# Patient Record
Sex: Male | Born: 1945 | Race: White | Hispanic: No | Marital: Married | Smoking: Current every day smoker
Health system: Southern US, Community
[De-identification: ages and names within clinical notes are randomized; demographics above are authoritative.]

## PROBLEM LIST (undated history)

## (undated) DIAGNOSIS — G459 Transient cerebral ischemic attack, unspecified: Secondary | ICD-10-CM

## (undated) DIAGNOSIS — D751 Secondary polycythemia: Secondary | ICD-10-CM

## (undated) DIAGNOSIS — I10 Essential (primary) hypertension: Secondary | ICD-10-CM

## (undated) DIAGNOSIS — H269 Unspecified cataract: Secondary | ICD-10-CM

## (undated) DIAGNOSIS — R7303 Prediabetes: Secondary | ICD-10-CM

## (undated) DIAGNOSIS — M109 Gout, unspecified: Secondary | ICD-10-CM

## (undated) DIAGNOSIS — J45909 Unspecified asthma, uncomplicated: Secondary | ICD-10-CM

## (undated) DIAGNOSIS — D72829 Elevated white blood cell count, unspecified: Secondary | ICD-10-CM

## (undated) DIAGNOSIS — J449 Chronic obstructive pulmonary disease, unspecified: Secondary | ICD-10-CM

## (undated) DIAGNOSIS — E7800 Pure hypercholesterolemia, unspecified: Secondary | ICD-10-CM

## (undated) HISTORY — DX: Prediabetes: R73.03

## (undated) HISTORY — DX: Unspecified cataract: H26.9

## (undated) HISTORY — DX: Elevated white blood cell count, unspecified: D72.829

## (undated) HISTORY — DX: Chronic obstructive pulmonary disease, unspecified: J44.9

## (undated) HISTORY — DX: Gout, unspecified: M10.9

## (undated) HISTORY — DX: Pure hypercholesterolemia, unspecified: E78.00

## (undated) HISTORY — DX: Transient cerebral ischemic attack, unspecified: G45.9

## (undated) HISTORY — DX: Secondary polycythemia: D75.1

## (undated) HISTORY — PX: HERNIA REPAIR: SHX51

## (undated) HISTORY — DX: Essential (primary) hypertension: I10

---

## 2006-09-07 ENCOUNTER — Emergency Department (HOSPITAL_COMMUNITY): Admission: EM | Admit: 2006-09-07 | Discharge: 2006-09-07 | Payer: Self-pay | Admitting: Emergency Medicine

## 2008-01-10 ENCOUNTER — Ambulatory Visit (HOSPITAL_COMMUNITY): Admission: RE | Admit: 2008-01-10 | Discharge: 2008-01-10 | Payer: Self-pay | Admitting: Internal Medicine

## 2008-01-10 ENCOUNTER — Ambulatory Visit: Payer: Self-pay | Admitting: Internal Medicine

## 2008-01-29 ENCOUNTER — Ambulatory Visit: Payer: Self-pay | Admitting: Internal Medicine

## 2008-01-29 ENCOUNTER — Ambulatory Visit (HOSPITAL_COMMUNITY): Admission: RE | Admit: 2008-01-29 | Discharge: 2008-01-29 | Payer: Self-pay | Admitting: Internal Medicine

## 2008-01-29 ENCOUNTER — Encounter: Payer: Self-pay | Admitting: Internal Medicine

## 2010-10-13 NOTE — Op Note (Signed)
NAME:  John Mcgrath, John Mcgrath                 ACCOUNT NO.:  [id number]   MEDICAL RECORD NO.:  [id number]          PATIENT TYPE:  AMB   LOCATION:  DAY                           FACILITY:  [facility]   PHYSICIAN:  R. Roetta Sessions, M.D. DATE OF BIRTH:  05-Dec-1945   DATE OF PROCEDURE:  01/29/2008  DATE OF DISCHARGE:                               OPERATIVE REPORT   PROCEDURE:  Ileocolonoscopy with snare polypectomy.   INDICATIONS FOR PROCEDURE:  This is a 65 year old gentleman sent over at  courtesy of [facility]inf[facility], FNP, in [place] for colorectal cancer  screening.  We attempted colonoscopy on January 10, 2008, but reported  with an most inadequate prep as a questionable rectal polyp at that time  seen.  He is now brought back in the setting of, hopefully, better prep.  Risks, benefits, alternatives, and limitations have been reviewed and  questions answered.  Please see the documentation in the medical record.   PROCEDURE NOTE:  O2 saturation, blood pressure, pulse, and respirations  were monitored throughout the entire procedure.   CONSCIOUS SEDATION:  Versed 3 mg IV and Demerol 75 mg IV in divided  doses.   INSTRUMENT:  Pentax video chip system.   FINDINGS:  Digital rectal exam revealed no abnormalities.  The scope was  placed, and the prep was adequate.  Colon:  Colonic mucosa was surveyed  from the rectosigmoid junction through the left transverse right colon  to the appendiceal orifice, ileocecal valve, and cecum.  These  structures were well seen and photographed for the record.  Terminal  ileum was intubated to 10 cm from this level.  The scope was slowly  withdrawn.  All previous mentioned mucosal surfaces were again seen.  The patient was noted to have pancolonic diverticula.  There were some  tiny submucosal petechiae around some of the sigmoid TICS; otherwise,  the colonic mucosa appeared entirely normal as did the terminal ileal  mucosa.  The scope was then pulled down into the  rectum where thorough  examination of rectal mucosa including retroflexed view of the anal  verge demonstrated an 8-mm polyp at 15 cm from the anal verge.  There  appeared to be somewhat hyperplastic.  It was removed with hot snare  cautery and recovered through the scope.  Remainder of the rectal mucosa  appeared normal.  The patient tolerated the procedure well and was  reacted in Endoscopy.   IMPRESSION:  1. Rectal polyp status post hot snare polypectomy.  2. Pancolonic diverticula.  3. Tiny petechiae sigmoid segment of doubtful clinical significance.  4. Remainder of the colonic mucosa and terminal mucosa appeared      normal.   RECOMMENDATIONS:  1. Diverticulosis.  Polyp literature provided to Mr. Hoeg.  2. Followup on path.  3. Further recommendations to follow.      Jonathon Bellows, M.D.  Electronically Signed     RMR/MEDQ  D:  01/29/2008  T:  01/30/2008  Job:  [id number]   cc:   Olegario Messier L. Jarold Motto, FNP

## 2010-10-13 NOTE — Op Note (Signed)
NAME:  John Mcgrath, John Mcgrath                 ACCOUNT NO.:  [id number]   MEDICAL RECORD NO.:  [id number]          PATIENT TYPE:  AMB   LOCATION:  DAY                           FACILITY:  [facility]   PHYSICIAN:  R. Roetta Sessions, M.D. DATE OF BIRTH:  May 12, 1946   DATE OF PROCEDURE:  01/10/2008  DATE OF DISCHARGE:                               OPERATIVE REPORT   PROCEDURE:  Incomplete colonoscopy.   INDICATIONS FOR PROCEDURE:  A 65 year old gentleman with no lower GI  tract symptoms, sent over at courtesy of [facility] in [place]  for colorectal cancer screening.  He has never had his lower GI tract  evaluated.  He has no lower GI tract symptoms.  There is no family  history of colorectal neoplasia.  The colonoscopy is now being done as  screening maneuver.  This approach has been discussed with the patient  at length.  Risks, benefits, and alternatives have been reviewed.  Questions answered.  Please see documentation in the medical record.   PROCEDURE NOTE:  O2 saturation, blood pressure, pulse, and respirations  were monitored throughout the entire procedure.   CONSCIOUS SEDATION:  Versed 3 mg IV, Demerol 75 mg IV in divided doses.   INSTRUMENT:  Pentax video chip system.   FINDINGS:  Digital rectal exam revealed no abnormalities.  Endoscopic  findings:  The prep was poor in the rectosigmoid and as I advanced the  scope up into the left colon, it became evident that the patient was not  adequately prepped with formed stool and tenacious semi-formed stool  lining the colonic wall, it was a very poor prep.  I did note some  sigmoid diverticula and in the rectum, there is a 1-cm flat adenomatous  appearing polyp in at approximately 15 cm.  The prep really rendered the  colonoscopy not doable today.  I elected to terminate the procedure.  The patient tolerated the brief procedure very well.   IMPRESSION:  Poor inadequate prep for complete colonoscopy (and very  poor for attempt at hot  snare polypectomy).  He does have a rectal polyp  and he has sigmoid diverticula at a minimum.   It is notable, this gentleman had to be rescheduled from yesterday due  to my untimely visit to the emergency department.  He tells me that he  only consumed a half-can of chicken noodle soup since yesterday, but  even so this pretty much amounts to a poor colon prep day before  yesterday.   RECOMMENDATIONS:  We will need to regroup and bring him back in the  setting of hopefully an adequate prep to clear his colon and remove  whatever polyps may exist.  This approach has been discussed with Mr.  Francesconi.      Jonathon Bellows, M.D.  Electronically Signed     RMR/MEDQ  D:  01/10/2008  T:  01/11/2008  Job:  [id number]   cc:   [place], [facility] L. Jarold Motto, FNP

## 2010-11-11 ENCOUNTER — Ambulatory Visit (HOSPITAL_COMMUNITY)
Admission: RE | Admit: 2010-11-11 | Discharge: 2010-11-11 | Disposition: A | Discharge status: Home / Self Care | Payer: BC Managed Care – PPO | Source: Ambulatory Visit | Attending: Family Medicine | Admitting: Family Medicine

## 2010-11-11 ENCOUNTER — Other Ambulatory Visit: Payer: Self-pay | Admitting: Family Medicine

## 2010-11-11 DIAGNOSIS — J4489 Other specified chronic obstructive pulmonary disease: Secondary | ICD-10-CM | POA: Insufficient documentation

## 2010-11-11 DIAGNOSIS — J449 Chronic obstructive pulmonary disease, unspecified: Secondary | ICD-10-CM | POA: Insufficient documentation

## 2010-11-11 DIAGNOSIS — R0989 Other specified symptoms and signs involving the circulatory and respiratory systems: Secondary | ICD-10-CM | POA: Insufficient documentation

## 2010-11-11 DIAGNOSIS — R0609 Other forms of dyspnea: Secondary | ICD-10-CM | POA: Insufficient documentation

## 2013-08-28 ENCOUNTER — Encounter (HOSPITAL_COMMUNITY): Payer: Medicare Other

## 2013-08-28 ENCOUNTER — Encounter (HOSPITAL_COMMUNITY): Payer: Self-pay

## 2013-08-28 ENCOUNTER — Encounter (HOSPITAL_COMMUNITY): Payer: Medicare Other | Attending: Hematology and Oncology

## 2013-08-28 VITALS — BP 135/90 | HR 57 | Temp 97.6°F | Resp 20 | Ht 70.0 in | Wt 214.0 lb

## 2013-08-28 DIAGNOSIS — D72820 Lymphocytosis (symptomatic): Secondary | ICD-10-CM | POA: Insufficient documentation

## 2013-08-28 DIAGNOSIS — J441 Chronic obstructive pulmonary disease with (acute) exacerbation: Secondary | ICD-10-CM | POA: Insufficient documentation

## 2013-08-28 DIAGNOSIS — J449 Chronic obstructive pulmonary disease, unspecified: Secondary | ICD-10-CM | POA: Insufficient documentation

## 2013-08-28 DIAGNOSIS — J309 Allergic rhinitis, unspecified: Secondary | ICD-10-CM | POA: Insufficient documentation

## 2013-08-28 DIAGNOSIS — R0902 Hypoxemia: Secondary | ICD-10-CM | POA: Insufficient documentation

## 2013-08-28 DIAGNOSIS — D751 Secondary polycythemia: Secondary | ICD-10-CM

## 2013-08-28 DIAGNOSIS — L299 Pruritus, unspecified: Secondary | ICD-10-CM | POA: Insufficient documentation

## 2013-08-28 DIAGNOSIS — E7800 Pure hypercholesterolemia, unspecified: Secondary | ICD-10-CM | POA: Insufficient documentation

## 2013-08-28 DIAGNOSIS — Z8673 Personal history of transient ischemic attack (TIA), and cerebral infarction without residual deficits: Secondary | ICD-10-CM | POA: Insufficient documentation

## 2013-08-28 DIAGNOSIS — R7309 Other abnormal glucose: Secondary | ICD-10-CM | POA: Insufficient documentation

## 2013-08-28 DIAGNOSIS — D45 Polycythemia vera: Secondary | ICD-10-CM | POA: Insufficient documentation

## 2013-08-28 DIAGNOSIS — F17200 Nicotine dependence, unspecified, uncomplicated: Secondary | ICD-10-CM | POA: Insufficient documentation

## 2013-08-28 DIAGNOSIS — I10 Essential (primary) hypertension: Secondary | ICD-10-CM | POA: Insufficient documentation

## 2013-08-28 DIAGNOSIS — M109 Gout, unspecified: Secondary | ICD-10-CM | POA: Insufficient documentation

## 2013-08-28 DIAGNOSIS — J4489 Other specified chronic obstructive pulmonary disease: Secondary | ICD-10-CM | POA: Insufficient documentation

## 2013-08-28 DIAGNOSIS — D72829 Elevated white blood cell count, unspecified: Secondary | ICD-10-CM | POA: Insufficient documentation

## 2013-08-28 LAB — CBC WITH DIFFERENTIAL/PLATELET
BASOS ABS: 0 10*3/uL (ref 0.0–0.1)
BASOS PCT: 0 % (ref 0–1)
EOS PCT: 2 % (ref 0–5)
Eosinophils Absolute: 0.2 10*3/uL (ref 0.0–0.7)
HCT: 52.8 % — ABNORMAL HIGH (ref 39.0–52.0)
Hemoglobin: 17.9 g/dL — ABNORMAL HIGH (ref 13.0–17.0)
Lymphocytes Relative: 47 % — ABNORMAL HIGH (ref 12–46)
Lymphs Abs: 5 10*3/uL — ABNORMAL HIGH (ref 0.7–4.0)
MCH: 32.3 pg (ref 26.0–34.0)
MCHC: 33.9 g/dL (ref 30.0–36.0)
MCV: 95.1 fL (ref 78.0–100.0)
Monocytes Absolute: 0.8 10*3/uL (ref 0.1–1.0)
Monocytes Relative: 7 % (ref 3–12)
Neutro Abs: 4.7 10*3/uL (ref 1.7–7.7)
Neutrophils Relative %: 44 % (ref 43–77)
PLATELETS: 147 10*3/uL — AB (ref 150–400)
RBC: 5.55 MIL/uL (ref 4.22–5.81)
RDW: 13.2 % (ref 11.5–15.5)
WBC: 10.7 10*3/uL — ABNORMAL HIGH (ref 4.0–10.5)

## 2013-08-28 LAB — COMPREHENSIVE METABOLIC PANEL
ALT: 13 U/L (ref 0–53)
AST: 18 U/L (ref 0–37)
Albumin: 3.4 g/dL — ABNORMAL LOW (ref 3.5–5.2)
Alkaline Phosphatase: 76 U/L (ref 39–117)
BUN: 20 mg/dL (ref 6–23)
CALCIUM: 9.2 mg/dL (ref 8.4–10.5)
CO2: 35 meq/L — AB (ref 19–32)
CREATININE: 0.99 mg/dL (ref 0.50–1.35)
Chloride: 95 mEq/L — ABNORMAL LOW (ref 96–112)
GFR calc Af Amer: >90 mL/min (ref >90)
GFR, EST NON AFRICAN AMERICAN: 84 mL/min — AB (ref >90)
GLUCOSE: 98 mg/dL (ref 70–99)
Potassium: 4.5 mEq/L (ref 3.7–5.3)
Sodium: 138 mEq/L (ref 137–147)
Total Bilirubin: 0.4 mg/dL (ref 0.3–1.2)
Total Protein: 6.9 g/dL (ref 6.0–8.3)

## 2013-08-28 LAB — LACTATE DEHYDROGENASE: LDH: 244 U/L (ref 94–250)

## 2013-08-28 LAB — RETICULOCYTES
RBC.: 5.55 MIL/uL (ref 4.22–5.81)
Retic Count, Absolute: 99.9 10*3/uL (ref 19.0–186.0)
Retic Ct Pct: 1.8 % (ref 0.4–3.1)

## 2013-08-28 LAB — CARBOXYHEMOGLOBIN
CARBOXYHEMOGLOBIN: 8.4 % — AB (ref 0.5–1.5)
Methemoglobin: 1.4 % (ref 0.0–1.5)
O2 Saturation: 60 %
Total hemoglobin: 19.4 g/dL — ABNORMAL HIGH (ref 13.5–18.0)
Total oxygen content: 14.7 mL/dL — ABNORMAL LOW (ref 15.0–23.0)

## 2013-08-28 NOTE — Patient Instructions (Signed)
[place] Discharge Instructions  RECOMMENDATIONS MADE BY THE CONSULTANT AND ANY TEST RESULTS WILL BE SENT TO YOUR REFERRING PHYSICIAN.  EXAM FINDINGS BY THE PHYSICIAN TODAY AND SIGNS OR SYMPTOMS TO REPORT TO CLINIC OR PRIMARY PHYSICIAN:  Return Thursday April 2 @ 9:30 for labs. This will complete your lab work up for now. Come to 4th floor and sign in.   Pulmonary Function Test (lung function test) in 3 weeks Wednesday September 19, 2012 @ 2:30. See the attached information so that you can get familiar with this test. When you come for this test, please stop at the desk @ the Main Entrance and the receptionist will register you. You will then come up to the 4th floor.   Return to see Dr. Barnet Glasgow in 3 weeks. Friday September 22, 2103 @ 8:30. Come to 4th floor.   Sleep study testing is the only thing left to get scheduled. Mazzie our scheduler will be in touch with you regarding this appointment date & time. This is done on the 4th floor also.  Decrease the amount of cigarettes you smoke daily by 1. If you can get down to 3 or 4 cigarettes a day, you will feel better.    Thank you for choosing [facility] to provide your oncology and hematology care.  To afford each patient quality time with our providers, please arrive at least 15 minutes before your scheduled appointment time.  With your help, our goal is to use those 15 minutes to complete the necessary work-up to ensure our physicians have the information they need to help with your evaluation and healthcare recommendations.    Effective January 1st, 2014, we ask that you re-schedule your appointment with our physicians should you arrive 10 or more minutes late for your appointment.  We strive to give you quality time with our providers, and arriving late affects you and other patients whose appointments are after yours.    Again, thank you for choosing [facility].  Our hope is that these requests  will decrease the amount of time that you wait before being seen by our physicians.       _____________________________________________________________  Should you have questions after your visit to [facility], please contact our office at [phone] between the hours of 8:30 a.m. and 5:00 p.m.  Voicemails left after 4:30 p.m. will not be returned until the following business day.  For prescription refill requests, have your pharmacy contact our office with your prescription refill request.    Pulmonary Function Tests Pulmonary function tests (PFTs) measure how well your lungs are working. The tests can help to identify the causes of lung problems. They can also help your health care provider select the best treatment for you. Your health care provider may order pulmonary function for any of the following reasons:  When an illness involving the lungs is suspected.  To follow changes in your lung function over time if you are known to have a chronic lung disease.  For industrial plant workers to examine the effects of being exposed to chemicals over a long period of time.  To assess lung function prior to surgery or other procedures.  For people who are smokers. Your measured lung function will be compared to the expected lung function of someone with healthy lungs who is similar to you in age, gender, size, and other factors. This is used to determine your "percent predicted" lung function, which is  how your health care provider knows if your lung function is normal or abnormal. If you have had prior pulmonary function testing performed, your health care provider will also compare your current results with past tests to see if your lung function is better, worse, or staying the same. This can sometimes be useful to see if treatments are working.  LET [facility] CARE PROVIDER KNOW ABOUT:  Any allergies you have.  All medicines you are taking, including inhaler or nebulizer  medicines, vitamins, herbs, eye drops, creams, and over-the-counter medicines.  Any blood disorders you have.  Previous surgeries you have had, especially recent eye surgery, abdominal surgery, or chest surgery. These can make performing pulmonary function tests difficult or unsafe.  Medical conditions you have.  Chest pain or heart problems.  Tuberculosis or respiratory infections, such as pneumonia, a cold, or the flu. If you think you will have difficulty performing any of the breathing maneuvers, ask your health care provider if you should reschedule the test. RISKS AND COMPLICATIONS: Generally, pulmonary function testing is a safe procedure. However, as with any procedure, complications can occur. Possible complications include:  Lightheadedness due to overbreathing (hyperventilation).  An asthmatic attack from deep breathing. BEFORE THE PROCEDURE  Take medicine as directed by your health care provider. If you take inhaler or nebulizer medicines, ask your health care provider which medicines you should take on the day of your test. Some inhaler medicines may interfere with pulmonary function tests, such as bronchodilator testing, if taken shortly before the test.  Avoid eating a large meal before your test.  Do not smoke before your test.  Wear comfortable clothing which will not interfere with breathing. PROCEDURE  You will be given a soft nose clip to wear during the procedure. This is done so that all of your breaths will go through your mouth instead of your nose.  You will be given a germ-free (sterile) mouthpiece. It will be attached to a spirometer. The spirometer is the machine that measures your breathing.  You will be instructed to perform various breathing maneuvers. The maneuvers will be done by breathing in (inhaling) and breathing out (exhaling). Depending on what measurements are ordered, you may be asked to repeat the maneuvers several times before the test is  completed.  It is important to follow the instructions exactly to obtain accurate results. Make sure to blow as hard and as fast as you can when you are instructed to do so.  You may be given a bronchodilator after testing has been performed. A bronchodilator is a medicine which makes the small air passages in your lungs larger. These medicines usually make it easier to breathe. The tests are then repeated several minutes later after the bronchodilator has taken effect.  You will be monitored carefully during the procedure for faintness, dizziness, difficulty breathing, or any other problems. AFTER THE PROCEDURE   You may resume your usual diet, medicines, and activities as directed by your health care provider.  Your health care provider will go over your test results with you and determine what treatments may be helpful. Document Released: 01/08/2004 Document Revised: 03/07/2013 Document Reviewed: 12/14/2012 [facility] Patient Information 2014 [place], [place].

## 2013-08-28 NOTE — Progress Notes (Signed)
CRITICAL VALUE ALERT  Critical value received:  Carboxyhemoglobin 8.4 Date of notification: 08/28/13 Time of notification:  1628 Critical value read back:yes Nurse who received alert:  Lupita Raider RN Dr. Barnet Glasgow notified @ [phone].  John Mcgrath presented for labwork. Labs per MD order drawn via Peripheral Line 23 gauge needle inserted in LT AC Good blood return present. Procedure without incident.  Needle removed intact. Patient tolerated procedure well.   Per [place], someone in Respiratory will notify patient of sleep study appointment date & time. Patient and his wife notified.

## 2013-08-28 NOTE — Progress Notes (Signed)
[place] A. Barnet Glasgow, M.D.  NEW PATIENT EVALUATION   Name: John Mcgrath Date: 08/29/2013 MRN: [id number] DOB: 07-13-1945  PCP: John Mcgrath, John Cooper, DO   REFERRING PHYSICIAN: No ref. provider Mcgrath  REASON FOR REFERRAL: Leukocytosis and polycythemia     HISTORY OF PRESENT ILLNESS:John Mcgrath is a 68 y.o. male who is referred by his family physician because of elevated white now with predominant lymphocytosis in addition to polycythemia. His outpatient erythropoietin level was normal at 13.7 done on 08/16/2013 with hemoglobin of 19.3 at that time associated with a white cell count 12.8, 40% lymphocytes. Patient suffers with chronic shortness of breath with cough with whitish expectoration. He continues to smoke about 1 pack of cigarettes daily which she's been doing for about 50 years. He also complains of generalized body itching for the last 4 years and can't wear deodorant either. She drinks 4 cans of beer per day. Hot baths produce an intense pruritus and erythema of the skin. He denies any melena, hematochezia, hematuria, epistaxis, but does admit to occasional urinary hesitancy without significant lower extremity swelling or purplish discoloration of his toes. He denies any PND, orthopnea, or palpitations. He also denies easy satiety, fever, or night sweats. According to his wife he does 4.   PAST MEDICAL HISTORY:  has a past medical history of Hypertension; Gout; COPD (chronic obstructive pulmonary disease); TIA (transient ischemic attack) (20 y ago); Cataract; Borderline diabetic; and High cholesterol.     PAST SURGICAL HISTORY: Past Surgical History  Procedure Laterality Date  . Hernia repair  @ 68 years of age & then again @ 68 years of age     CURRENT MEDICATIONS: has a current medication list which includes the following prescription(s): allopurinol, amlodipine-benazepril, aspirin, indomethacin, loratadine, metoprolol  succinate, simvastatin, and tiotropium.   ALLERGIES: Review of patient's allergies indicates John known allergies.   SOCIAL HISTORY:  reports that he has been smoking.  He has never used smokeless tobacco. He reports that he drinks about 12.6 ounces of alcohol per week. He reports that he does not use illicit drugs.   FAMILY HISTORY: family history is not on file.    REVIEW OF SYSTEMS:  Other than that discussed above is noncontributory.    PHYSICAL EXAM:  height is $RemoveB'5\' 10"'SbBIatMt$  (1.778 m) and weight is 214 lb (97.07 kg). His oral temperature is 97.6 F (36.4 C). His blood pressure is 135/90 and his pulse is 57. His respiration is 20 and oxygen saturation is 91%.    GENERAL:alert, John distress and comfortable. Plethora complexion. Morbidly obese. SKIN: skin texture, turgor are normal, John rashes or significant lesions EYES: normal, conjunctival erythema bilaterally with John ptosis OROPHARYNX:John exudate, John erythema and lips, buccal mucosa, and tongue normal  NECK: supple, thyroid normal size, non-tender, without nodularity. Thick CHEST: Increased AP diameter with John gynecomastia. LYMPH:  John palpable lymphadenopathy in the cervical, axillary or inguinal LUNGS: Scattered expiratory rhonchi bilaterally. HEART: regular rate & rhythm and John murmurs. John S3. ABDOMEN:abdomen soft, non-tender and normal bowel sounds MUSCULOSKELETALl:John cyanosis of digits, John clubbing or edema . Upper extremity areas of ecchymosis. NEURO: alert & oriented x 3 with fluent speech, John focal motor/sensory deficits    LABORATORY DATA:  Oxygen saturation, room air, at rest:  88.9%   Office Visit on 08/28/2013  Component Date Value Ref Range Status  . WBC 08/28/2013 10.7* 4.0 - 10.5 K/uL Final  . RBC  08/28/2013 5.55  4.22 - 5.81 MIL/uL Final  . Hemoglobin 08/28/2013 17.9* 13.0 - 17.0 g/dL Final  . HCT 08/28/2013 52.8* 39.0 - 52.0 % Final  . MCV 08/28/2013 95.1  78.0 - 100.0 fL Final  . MCH 08/28/2013 32.3  26.0 -  34.0 pg Final  . MCHC 08/28/2013 33.9  30.0 - 36.0 g/dL Final  . RDW 08/28/2013 13.2  11.5 - 15.5 % Final  . Platelets 08/28/2013 147* 150 - 400 K/uL Final  . Neutrophils Relative % 08/28/2013 44  43 - 77 % Final  . Neutro Abs 08/28/2013 4.7  1.7 - 7.7 K/uL Final  . Lymphocytes Relative 08/28/2013 47* 12 - 46 % Final  . Lymphs Abs 08/28/2013 5.0* 0.7 - 4.0 K/uL Final  . Monocytes Relative 08/28/2013 7  3 - 12 % Final  . Monocytes Absolute 08/28/2013 0.8  0.1 - 1.0 K/uL Final  . Eosinophils Relative 08/28/2013 2  0 - 5 % Final  . Eosinophils Absolute 08/28/2013 0.2  0.0 - 0.7 K/uL Final  . Basophils Relative 08/28/2013 0  0 - 1 % Final  . Basophils Absolute 08/28/2013 0.0  0.0 - 0.1 K/uL Final  . Retic Ct Pct 08/28/2013 1.8  0.4 - 3.1 % Final  . RBC. 08/28/2013 5.55  4.22 - 5.81 MIL/uL Final  . Retic Count, Manual 08/28/2013 99.9  19.0 - 186.0 K/uL Final  . Sodium 08/28/2013 138  137 - 147 mEq/L Final  . Potassium 08/28/2013 4.5  3.7 - 5.3 mEq/L Final  . Chloride 08/28/2013 95* 96 - 112 mEq/L Final  . CO2 08/28/2013 35* 19 - 32 mEq/L Final  . Glucose, Bld 08/28/2013 98  70 - 99 mg/dL Final  . BUN 08/28/2013 20  6 - 23 mg/dL Final  . Creatinine, Ser 08/28/2013 0.99  0.50 - 1.35 mg/dL Final  . Calcium 08/28/2013 9.2  8.4 - 10.5 mg/dL Final  . Total Protein 08/28/2013 6.9  6.0 - 8.3 g/dL Final  . Albumin 08/28/2013 3.4* 3.5 - 5.2 g/dL Final  . AST 08/28/2013 18  0 - 37 U/L Final  . ALT 08/28/2013 13  0 - 53 U/L Final  . Alkaline Phosphatase 08/28/2013 76  39 - 117 U/L Final  . Total Bilirubin 08/28/2013 0.4  0.3 - 1.2 mg/dL Final  . GFR calc non Af Amer 08/28/2013 84* >90 mL/min Final  . GFR calc Af Amer 08/28/2013 >90  >90 mL/min Final   Comment: (NOTE)                          The eGFR has been calculated using the CKD EPI equation.                          This calculation has not been validated in all clinical situations.                          eGFR's persistently <90 mL/min  signify possible Chronic Kidney                          Disease.  Marland Kitchen LDH 08/28/2013 244  94 - 250 U/L Final   SLIGHT HEMOLYSIS  . Total hemoglobin 08/28/2013 19.4* 13.5 - 18.0 g/dL Final  . O2 Saturation 08/28/2013 60.0   Final  . Carboxyhemoglobin 08/28/2013 8.4* 0.5 - 1.5 % Final  Comment: CRITICAL RESULT CALLED TO, READ BACK BY AND VERIFIED WITH:                          BRAY,H.RN 08/28/13 AT 1625 BY BROADNAX,L.RRT  . Methemoglobin 08/28/2013 1.4  0.0 - 1.5 % Final  . Total oxygen content 08/28/2013 14.7* 15.0 - 23.0 mL/dL Final    Urinalysis John results Mcgrath for this basename: colorurine,  appearanceur,  labspec,  phurine,  glucoseu,  hgbur,  bilirubinur,  ketonesur,  proteinur,  urobilinogen,  nitrite,  leukocytesur      '@RADIOGRAPHY'$ : John results Mcgrath.  PATHOLOGY: Peripheral smear failed to reveal evidence of premature forms.   IMPRESSION:  #1. Probable reactive polycythemia, possible myeloproliferative disorder as well based upon the patient's symptomatology, erythropoietin level normal. #2. Chronic obstructive pulmonary disease, still smoking one pack of cigarettes daily. #3. Chronic hypoxia with resting O2 sat uration of 88.9%. #4. 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 #5. History of TIA in the remote past, John evidence of neurologic deficit at this time. #6. Gout, on chronic allopurinol therapy. #7. Allergic rhinitis, on long-term antihistamines orally.   PLAN:  #1. Pulmonary function tests on 09/19/2013. #2. Polysomnography. #3. Patient was admonished to decrease cigarette smoking by one cigarette each day until he is down to 3 or 4 per day. This was stated in the presence of his wife who agreed I. #4. Additional lab tests were done today including a BCR/ABL and JAK-2 mutation studies. #5. Due to relative absolute increase in lymphocytes, peripheral blood cytometry was done as well. #6. Patient was told to continue his current medications and will probably be an  excellent candidate for a low-flow oxygen overnight and probably CPAP. #7. Followup in 4 weeks with CBC.  I appreciate the opportunity of sharing in his care   Doroteo Bradford, MD 08/29/2013 6:45 AM

## 2013-08-30 ENCOUNTER — Encounter (HOSPITAL_COMMUNITY): Payer: Medicare Other | Attending: Hematology and Oncology

## 2013-08-30 DIAGNOSIS — D72829 Elevated white blood cell count, unspecified: Secondary | ICD-10-CM

## 2013-08-30 DIAGNOSIS — C9110 Chronic lymphocytic leukemia of B-cell type not having achieved remission: Secondary | ICD-10-CM | POA: Insufficient documentation

## 2013-08-30 DIAGNOSIS — D45 Polycythemia vera: Secondary | ICD-10-CM | POA: Insufficient documentation

## 2013-08-30 DIAGNOSIS — J4489 Other specified chronic obstructive pulmonary disease: Secondary | ICD-10-CM | POA: Insufficient documentation

## 2013-08-30 DIAGNOSIS — D751 Secondary polycythemia: Secondary | ICD-10-CM

## 2013-08-30 DIAGNOSIS — R0902 Hypoxemia: Secondary | ICD-10-CM | POA: Insufficient documentation

## 2013-08-30 DIAGNOSIS — J449 Chronic obstructive pulmonary disease, unspecified: Secondary | ICD-10-CM | POA: Insufficient documentation

## 2013-08-30 NOTE — Progress Notes (Signed)
John Mcgrath presented for labwork. Labs per MD order drawn via Peripheral Line 23 gauge needle inserted in lt ac  Good blood return present. Procedure without incident.  Needle removed intact. Patient tolerated procedure well.

## 2013-08-31 LAB — P190 BCR-ABL 1: P190 BCR ABL1: NOT DETECTED

## 2013-08-31 LAB — BCR/ABL GENE REARRANGEMENT QNT, PCR
BCR ABL1 / ABL1 IS: 0 %
BCR ABL1/ABL1: 0 %

## 2013-08-31 LAB — P210 BCR-ABL 1: P210 BCR ABL1: NOT DETECTED

## 2013-08-31 NOTE — Progress Notes (Signed)
Labs drawn at physician visit encounter.

## 2013-09-06 LAB — JAK2 GENOTYPR: JAK2 GenotypR: NOT DETECTED

## 2013-09-18 ENCOUNTER — Ambulatory Visit (HOSPITAL_COMMUNITY): Payer: Medicare Other

## 2013-09-19 ENCOUNTER — Ambulatory Visit (HOSPITAL_COMMUNITY)
Admission: RE | Admit: 2013-09-19 | Discharge: 2013-09-19 | Disposition: A | Discharge status: Home / Self Care | Payer: Medicare Other | Source: Ambulatory Visit | Attending: Hematology and Oncology | Admitting: Hematology and Oncology

## 2013-09-19 DIAGNOSIS — R0989 Other specified symptoms and signs involving the circulatory and respiratory systems: Principal | ICD-10-CM | POA: Insufficient documentation

## 2013-09-19 DIAGNOSIS — J449 Chronic obstructive pulmonary disease, unspecified: Secondary | ICD-10-CM

## 2013-09-19 DIAGNOSIS — D751 Secondary polycythemia: Secondary | ICD-10-CM

## 2013-09-19 DIAGNOSIS — D72829 Elevated white blood cell count, unspecified: Secondary | ICD-10-CM

## 2013-09-19 DIAGNOSIS — R05 Cough: Secondary | ICD-10-CM | POA: Insufficient documentation

## 2013-09-19 DIAGNOSIS — R059 Cough, unspecified: Secondary | ICD-10-CM | POA: Insufficient documentation

## 2013-09-19 DIAGNOSIS — R0609 Other forms of dyspnea: Secondary | ICD-10-CM | POA: Insufficient documentation

## 2013-09-19 DIAGNOSIS — F17200 Nicotine dependence, unspecified, uncomplicated: Secondary | ICD-10-CM | POA: Insufficient documentation

## 2013-09-19 DIAGNOSIS — R0902 Hypoxemia: Secondary | ICD-10-CM

## 2013-09-19 LAB — PULMONARY FUNCTION TEST
DL/VA % pred: 83 %
DL/VA: 3.79 ml/min/mmHg/L
DLCO COR % PRED: 57 %
DLCO UNC % PRED: 57 %
DLCO cor: 17.79 ml/min/mmHg
DLCO unc: 17.79 ml/min/mmHg
FEF 25-75 Post: 0.49 L/sec
FEF 25-75 Pre: 0.36 L/sec
FEF2575-%CHANGE-POST: 36 %
FEF2575-%PRED-POST: 18 %
FEF2575-%PRED-PRE: 13 %
FEV1-%Change-Post: 19 %
FEV1-%PRED-PRE: 26 %
FEV1-%Pred-Post: 31 %
FEV1-Post: 1.04 L
FEV1-Pre: 0.87 L
FEV1FVC-%Change-Post: 3 %
FEV1FVC-%PRED-PRE: 52 %
FEV6-%CHANGE-POST: 14 %
FEV6-%PRED-POST: 56 %
FEV6-%Pred-Pre: 49 %
FEV6-POST: 2.35 L
FEV6-Pre: 2.06 L
FEV6FVC-%CHANGE-POST: 0 %
FEV6FVC-%Pred-Post: 96 %
FEV6FVC-%Pred-Pre: 97 %
FVC-%Change-Post: 15 %
FVC-%PRED-POST: 58 %
FVC-%Pred-Pre: 50 %
FVC-Post: 2.57 L
FVC-Pre: 2.23 L
POST FEV1/FVC RATIO: 41 %
Post FEV6/FVC ratio: 92 %
Pre FEV1/FVC ratio: 39 %
Pre FEV6/FVC Ratio: 92 %
RV % pred: 198 %
RV: 4.56 L
TLC % pred: 108 %
TLC: 7.4 L

## 2013-09-19 MED ORDER — ALBUTEROL SULFATE (2.5 MG/3ML) 0.083% IN NEBU
2.5000 mg | INHALATION_SOLUTION | Freq: Once | RESPIRATORY_TRACT | Status: AC
  Administered 2013-09-19: 2.5 mg via RESPIRATORY_TRACT

## 2013-09-21 ENCOUNTER — Encounter (HOSPITAL_BASED_OUTPATIENT_CLINIC_OR_DEPARTMENT_OTHER): Payer: Medicare Other

## 2013-09-21 ENCOUNTER — Encounter (HOSPITAL_COMMUNITY): Payer: Self-pay

## 2013-09-21 VITALS — BP 145/79 | HR 62 | Temp 97.8°F | Resp 21 | Wt 214.3 lb

## 2013-09-21 DIAGNOSIS — D72829 Elevated white blood cell count, unspecified: Secondary | ICD-10-CM

## 2013-09-21 DIAGNOSIS — C9110 Chronic lymphocytic leukemia of B-cell type not having achieved remission: Secondary | ICD-10-CM

## 2013-09-21 DIAGNOSIS — R059 Cough, unspecified: Secondary | ICD-10-CM

## 2013-09-21 DIAGNOSIS — F17200 Nicotine dependence, unspecified, uncomplicated: Secondary | ICD-10-CM

## 2013-09-21 DIAGNOSIS — D693 Immune thrombocytopenic purpura: Secondary | ICD-10-CM | POA: Insufficient documentation

## 2013-09-21 DIAGNOSIS — D6959 Other secondary thrombocytopenia: Secondary | ICD-10-CM

## 2013-09-21 DIAGNOSIS — R05 Cough: Secondary | ICD-10-CM

## 2013-09-21 LAB — CBC WITH DIFFERENTIAL/PLATELET
Basophils Absolute: 0.1 10*3/uL (ref 0.0–0.1)
Basophils Relative: 0 % (ref 0–1)
EOS ABS: 0.3 10*3/uL (ref 0.0–0.7)
EOS PCT: 2 % (ref 0–5)
HEMATOCRIT: 54.8 % — AB (ref 39.0–52.0)
Hemoglobin: 18.3 g/dL — ABNORMAL HIGH (ref 13.0–17.0)
LYMPHS PCT: 37 % (ref 12–46)
Lymphs Abs: 4.2 10*3/uL — ABNORMAL HIGH (ref 0.7–4.0)
MCH: 31.3 pg (ref 26.0–34.0)
MCHC: 33.4 g/dL (ref 30.0–36.0)
MCV: 93.7 fL (ref 78.0–100.0)
MONO ABS: 0.9 10*3/uL (ref 0.1–1.0)
MONOS PCT: 8 % (ref 3–12)
Neutro Abs: 6.1 10*3/uL (ref 1.7–7.7)
Neutrophils Relative %: 53 % (ref 43–77)
PLATELETS: 138 10*3/uL — AB (ref 150–400)
RBC: 5.85 MIL/uL — ABNORMAL HIGH (ref 4.22–5.81)
RDW: 13.7 % (ref 11.5–15.5)
WBC: 11.5 10*3/uL — ABNORMAL HIGH (ref 4.0–10.5)

## 2013-09-21 NOTE — Patient Instructions (Signed)
John Mcgrath Discharge Instructions  RECOMMENDATIONS MADE BY THE CONSULTANT AND ANY TEST RESULTS WILL BE SENT TO YOUR REFERRING PHYSICIAN.  EXAM FINDINGS BY THE PHYSICIAN TODAY AND SIGNS OR SYMPTOMS TO REPORT TO CLINIC OR PRIMARY PHYSICIAN: Exam and findings as discussed by Dr. Barnet Glasgow.  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  We need to get a sleep study performed and scans of your chest abdomen and pelvis to see if there's any enlarged lymph nodes.  MEDICATIONS PRESCRIBED:  none  INSTRUCTIONS/FOLLOW-UP: Sleep Study on Sunday April 26.  Need to report to [facility] Registration at 8 pm and study will end between 6 & 7 am Monday morning. Scans on May 1 Office visit on May 8 to discuss results.  Thank you for choosing [facility] to provide your oncology and hematology care.  To afford each patient quality time with our providers, please arrive at least 15 minutes before your scheduled appointment time.  With your help, our goal is to use those 15 minutes to complete the necessary work-up to ensure our physicians have the information they need to help with your evaluation and healthcare recommendations.    Effective January 1st, 2014, we ask that you re-schedule your appointment with our physicians should you arrive 10 or more minutes late for your appointment.  We strive to give you quality time with our providers, and arriving late affects you and other patients whose appointments are after yours.    Again, thank you for choosing [facility].  Our hope is that these requests will decrease the amount of time that you wait before being seen by our physicians.       _____________________________________________________________  Should you have questions after your visit to [facility], please contact our office at [phone] between the hours of 8:30 a.m. and  5:00 p.m.  Voicemails left after 4:30 p.m. will not be returned until the following business day.  For prescription refill requests, have your pharmacy contact our office with your prescription refill request.     Polycythemia Vera  Polycythemia John Mcgrath is a condition in which the body makes too many red blood cells and there is no known cause. The red blood cells (erythrocytes) are the cells which carry the oxygen in your blood stream to the cells of your body. Because of the increased red blood cells, the blood becomes thicker and does not circulate as well. It would be similar to your car having oil which is too thick so it cannot start and circulate as well. When the blood is too thick it often causes headaches and dizziness. It may also cause blood clots. Even though the blood clots easier, these patients bleed easier. The bleeding is caused because the blood cells which help stop bleeding (platelets) do not function normally. It occurs in all age groups but is more common in the 93 to 66 year age range. TREATMENT  The treatment of polycythemia vera for many years has been blood removal (phlebotomy) which is similar to blood removal in a blood bank, however this blood is not used for donation. Hydroxyurea is used to supplement phlebotomy. Aspirin is commonly given to thin the blood as long as the patient does not have a problem with bleeding. Other drugs are used based on the progression of the disease. Document Released: 02/09/2001 Document Revised: 08/09/2011 Document Reviewed: 08/16/2008 ExitCare Patient Information 2014 [place].  

## 2013-09-21 NOTE — Progress Notes (Signed)
Labs drawn today for zap70,cbc/diff

## 2013-09-21 NOTE — Progress Notes (Signed)
[place]  OFFICE PROGRESS NOTE  Velta Addison, Claretha Cooper, DO [address] [place] [address]  DIAGNOSIS: Leukocytosis - Plan: Zap-70, CBC with Differential, Zap-70, CBC with Differential  lymphoproliferative disorder, NOS - Plan: CT Abdomen Pelvis W Contrast, CT Chest W Contrast  Thrombocytopenia, immune  Chief Complaint  Patient presents with  . Lymphoproliferative disorder  . Secondary polycythemia    CURRENT THERAPY: Undergoing workup for leukocytosis and polycythemia.  INTERVAL HISTORY: John Mcgrath 68 y.o. male returns for followup after additional testing for leukocytosis and polycythemia. He continues to manifest pruritus despite taking loratadine 10 mg daily. Chronic cough persists and he continues to smoke about half a pack of cigarettes daily. He denies any PND, orthopnea, or palpitations but has had chronic cough without expectoration or hemoptysis. He denies any lower extremity swelling, redness, fever, night sweats, abdominal pain, nausea, vomiting, diarrhea, constipation, dysuria, but does get up at night to urinate.  MEDICAL HISTORY: Past Medical History  Diagnosis Date  . Hypertension   . Gout   . COPD (chronic obstructive pulmonary disease)   . TIA (transient ischemic attack) 20 y ago  . Cataract     starting to form per pt per eye md  . Borderline diabetic   . High cholesterol     INTERIM HISTORY: has Leukocytosis; Polycythemia; Chronic obstructive pulmonary disease; Hypoxia; Chronic lymphocytic leukemia; and Thrombocytopenia, immune on his problem list.    ALLERGIES:  has No Known Allergies.  MEDICATIONS: has a current medication list which includes the following prescription(s): allopurinol, amlodipine-benazepril, aspirin, indomethacin, loratadine, metoprolol succinate, simvastatin, and tiotropium.  SURGICAL HISTORY:  Past Surgical History  Procedure Laterality Date  . Hernia repair  @ 68 years of age & then  again @ 68 years of age    FAMILY HISTORY: family history is not on file.  SOCIAL HISTORY:  reports that he has been smoking.  He has never used smokeless tobacco. He reports that he drinks about 12.6 ounces of alcohol per week. He reports that he does not use illicit drugs.  REVIEW OF SYSTEMS:  Other than that discussed above is noncontributory.  PHYSICAL EXAMINATION: ECOG PERFORMANCE STATUS: 1 - Symptomatic but completely ambulatory  Blood pressure 145/79, pulse 62, temperature 97.8 F (36.6 C), temperature source Oral, resp. rate 21, weight 214 lb 4.8 oz (97.206 kg), SpO2 0.00%.  GENERAL:alert, no distress and comfortable. No cyanosis. SKIN: skin color, texture, turgor are normal, no rashes or significant lesions EYES: PERLA; Conjunctiva are pink and non-injected, sclera clear SINUSES: No redness or tenderness over maxillary or ethmoid sinuses OROPHARYNX:no exudate, no erythema on lips, buccal mucosa, or tongue. NECK: supple, thyroid normal size, non-tender, without nodularity. No masses CHEST: Increased AP diameter with no gynecomastia. LYMPH:  no palpable lymphadenopathy in the cervical, axillary or inguinal LUNGS: clear to auscultation and percussion with normal breathing effort HEART: regular rate & rhythm and no murmurs. ABDOMEN:abdomen soft, non-tender and normal bowel sounds MUSCULOSKELETAL:no cyanosis of digits and no clubbing. Range of motion normal.  NEURO: alert & oriented x 3 with fluent speech, no focal motor/sensory deficits   LABORATORY DATA: Office Visit on 09/21/2013  Component Date Value Ref Range Status  . WBC 09/21/2013 11.5* 4.0 - 10.5 K/uL Final  . RBC 09/21/2013 5.85* 4.22 - 5.81 MIL/uL Final  . Hemoglobin 09/21/2013 18.3* 13.0 - 17.0 g/dL Final  . HCT 09/21/2013 54.8* 39.0 - 52.0 % Final  . MCV 09/21/2013 93.7  78.0 - 100.0 fL Final  . MCH 09/21/2013 31.3  26.0 - 34.0 pg Final  . MCHC 09/21/2013 33.4  30.0 - 36.0 g/dL Final  . RDW 09/21/2013 13.7   11.5 - 15.5 % Final  . Platelets 09/21/2013 138* 150 - 400 K/uL Final  . Neutrophils Relative % 09/21/2013 53  43 - 77 % Final  . Neutro Abs 09/21/2013 6.1  1.7 - 7.7 K/uL Final  . Lymphocytes Relative 09/21/2013 37  12 - 46 % Final  . Lymphs Abs 09/21/2013 4.2* 0.7 - 4.0 K/uL Final  . Monocytes Relative 09/21/2013 8  3 - 12 % Final  . Monocytes Absolute 09/21/2013 0.9  0.1 - 1.0 K/uL Final  . Eosinophils Relative 09/21/2013 2  0 - 5 % Final  . Eosinophils Absolute 09/21/2013 0.3  0.0 - 0.7 K/uL Final  . Basophils Relative 09/21/2013 0  0 - 1 % Final  . Basophils Absolute 09/21/2013 0.1  0.0 - 0.1 K/uL Final  Hospital Outpatient Visit on 09/19/2013  Component Date Value Ref Range Status  . FVC-Pre 09/19/2013 2.23   Final  . FVC-%Pred-Pre 09/19/2013 50   Final  . FVC-Post 09/19/2013 2.57   Final  . FVC-%Pred-Post 09/19/2013 58   Final  . FVC-%Change-Post 09/19/2013 15   Final  . FEV1-Pre 09/19/2013 0.87   Final  . FEV1-%Pred-Pre 09/19/2013 26   Final  . FEV1-Post 09/19/2013 1.04   Final  . FEV1-%Pred-Post 09/19/2013 31   Final  . FEV1-%Change-Post 09/19/2013 19   Final  . FEV6-Pre 09/19/2013 2.06   Final  . FEV6-%Pred-Pre 09/19/2013 49   Final  . FEV6-Post 09/19/2013 2.35   Final  . FEV6-%Pred-Post 09/19/2013 56   Final  . FEV6-%Change-Post 09/19/2013 14   Final  . Pre FEV1/FVC ratio 09/19/2013 39   Final  . FEV1FVC-%Pred-Pre 09/19/2013 52   Final  . Post FEV1/FVC ratio 09/19/2013 41   Final  . FEV1FVC-%Change-Post 09/19/2013 3   Final  . Pre FEV6/FVC Ratio 09/19/2013 92   Final  . FEV6FVC-%Pred-Pre 09/19/2013 97   Final  . Post FEV6/FVC ratio 09/19/2013 92   Final  . FEV6FVC-%Pred-Post 09/19/2013 96   Final  . FEV6FVC-%Change-Post 09/19/2013 0   Final  . FEF 25-75 Pre 09/19/2013 0.36   Final  . FEF2575-%Pred-Pre 09/19/2013 13   Final  . FEF 25-75 Post 09/19/2013 0.49   Final  . FEF2575-%Pred-Post 09/19/2013 18   Final  . FEF2575-%Change-Post 09/19/2013 36   Final  . RV  09/19/2013 4.56   Final  . RV % pred 09/19/2013 198   Final  . TLC 09/19/2013 7.40   Final  . TLC % pred 09/19/2013 108   Final  . DLCO unc 09/19/2013 17.79   Final  . DLCO unc % pred 09/19/2013 57   Final  . DLCO cor 09/19/2013 17.79   Final  . DLCO cor % pred 09/19/2013 57   Final  . DL/VA 09/19/2013 3.79   Final  . DL/VA % pred 09/19/2013 83   Final  Office Visit on 08/28/2013  Component Date Value Ref Range Status  . WBC 08/28/2013 10.7* 4.0 - 10.5 K/uL Final  . RBC 08/28/2013 5.55  4.22 - 5.81 MIL/uL Final  . Hemoglobin 08/28/2013 17.9* 13.0 - 17.0 g/dL Final  . HCT 08/28/2013 52.8* 39.0 - 52.0 % Final  . MCV 08/28/2013 95.1  78.0 - 100.0 fL Final  . MCH 08/28/2013 32.3  26.0 - 34.0 pg Final  . MCHC 08/28/2013  33.9  30.0 - 36.0 g/dL Final  . RDW 08/28/2013 13.2  11.5 - 15.5 % Final  . Platelets 08/28/2013 147* 150 - 400 K/uL Final  . Neutrophils Relative % 08/28/2013 44  43 - 77 % Final  . Neutro Abs 08/28/2013 4.7  1.7 - 7.7 K/uL Final  . Lymphocytes Relative 08/28/2013 47* 12 - 46 % Final  . Lymphs Abs 08/28/2013 5.0* 0.7 - 4.0 K/uL Final  . Monocytes Relative 08/28/2013 7  3 - 12 % Final  . Monocytes Absolute 08/28/2013 0.8  0.1 - 1.0 K/uL Final  . Eosinophils Relative 08/28/2013 2  0 - 5 % Final  . Eosinophils Absolute 08/28/2013 0.2  0.0 - 0.7 K/uL Final  . Basophils Relative 08/28/2013 0  0 - 1 % Final  . Basophils Absolute 08/28/2013 0.0  0.0 - 0.1 K/uL Final  . Retic Ct Pct 08/28/2013 1.8  0.4 - 3.1 % Final  . RBC. 08/28/2013 5.55  4.22 - 5.81 MIL/uL Final  . Retic Count, Manual 08/28/2013 99.9  19.0 - 186.0 K/uL Final  . Sodium 08/28/2013 138  137 - 147 mEq/L Final  . Potassium 08/28/2013 4.5  3.7 - 5.3 mEq/L Final  . Chloride 08/28/2013 95* 96 - 112 mEq/L Final  . CO2 08/28/2013 35* 19 - 32 mEq/L Final  . Glucose, Bld 08/28/2013 98  70 - 99 mg/dL Final  . BUN 08/28/2013 20  6 - 23 mg/dL Final  . Creatinine, Ser 08/28/2013 0.99  0.50 - 1.35 mg/dL Final  .  Calcium 08/28/2013 9.2  8.4 - 10.5 mg/dL Final  . Total Protein 08/28/2013 6.9  6.0 - 8.3 g/dL Final  . Albumin 08/28/2013 3.4* 3.5 - 5.2 g/dL Final  . AST 08/28/2013 18  0 - 37 U/L Final  . ALT 08/28/2013 13  0 - 53 U/L Final  . Alkaline Phosphatase 08/28/2013 76  39 - 117 U/L Final  . Total Bilirubin 08/28/2013 0.4  0.3 - 1.2 mg/dL Final  . GFR calc non Af Amer 08/28/2013 84* >90 mL/min Final  . GFR calc Af Amer 08/28/2013 >90  >90 mL/min Final   Comment: (NOTE)                          The eGFR has been calculated using the CKD EPI equation.                          This calculation has not been validated in all clinical situations.                          eGFR's persistently <90 mL/min signify possible Chronic Kidney                          Disease.  Marland Kitchen LDH 08/28/2013 244  94 - 250 U/L Final   SLIGHT HEMOLYSIS  . BCR ABL1 / ABL1 08/28/2013 0.000   Final  . BCR ABL1 / ABL1 IS 08/28/2013 0.000   Final  . Interpretation - BCRQ 08/28/2013 REPORT   Final   Comment: (NOTE)                          The P190 and P210 BCR-ABL1 fusion transcripts are NOT  detected.                          Reverse transcription real-time PCR is performed for                          the P190 and P210 BCR-ABL1 transcripts associated with                          the t(9;22) chromosomal translocation. For P190,                          results are expressed as a percent ratio of BCR-ABL1                          to the ABL1 transcript, and further adjusted to the                          international scale (IS) for P210.                          Assay sensitivity is dependent on RNA quality and                          sample cellularity but is usually at least 4-logs                          below BCR-ABL1 baseline transcript levels. Reference                          range is 0.000% BCR-ABL1/ABL1.                          This test was developed and its performance                           characteristics have been determined by [employer], [place], [place].                          Performance characteristics refer to the                          analytical performance of the test.                                                                               Dr. Denton Lank, M.D.                          Performed at [facility]  . JAK2 GenotypR 08/28/2013 Not Detected   Final  Comment: (NOTE)                                   ** Normal Reference Range: Not Detected **                          Clinical Utility:                          The somatic acquired mutation affecting Janus Tyrosine Kinase 2 (JAK2                          V617F) in exon 14 is associated with myeloproliferative disorders                          (MPD).  JAK2 V617F has been found to be the most common molecular                          abnormality in patients with Polycythemia Vera (PV, >90%) or Essential                          Thombocythemia (ET, 35% - 70%).  The lowest frequency is found in IMF                          patients (chronic Idiopathic Myelofibrosis, 50%).  The presence of the                          JAK2 mutation causes activation of molecular signals that lead to                          proliferation of hematopoietic precursors outside of their normal                          pathways including erythropoietin-independent erythroid colony growth                          in most patients with PV and some patients with ET.  The JAK2 mutation                          is considered the main oncogenic event responsible for PV development                          but its precise role in ET and IMF remains questionable and may                          suggest the requirement of other genetic events to induce these                          pathologies.  The absence of JAK2 V617F does not exclude other                           changes, including in the exon 12.  Test Methodology:                          Patient DNA is assayed using allele specific PCR technology from                          Qiagen and is tested using the Roche Light Cycler Real Time PCR                          analyzer. This assay is reported as detected when >5% of cells show                          the presence of the JAK2 V617F mutation.                          This test was performed using a kit that has not been cleared or                          approved by the FDA.  The analytical performance characteristics of                          this test have been determined by [facility].  This                          test may not be used for diagnostic, prognostic or monitoring purposes                          without confirmation by other medically established means.                          Performed at [facility]  . Total hemoglobin 08/28/2013 19.4* 13.5 - 18.0 g/dL Final  . O2 Saturation 08/28/2013 60.0   Final  . Carboxyhemoglobin 08/28/2013 8.4* 0.5 - 1.5 % Final   Comment: CRITICAL RESULT CALLED TO, READ BACK BY AND VERIFIED WITH:                          BRAY,H.RN 08/28/13 AT 1625 BY BROADNAX,L.RRT  . Methemoglobin 08/28/2013 1.4  0.0 - 1.5 % Final  . Total oxygen content 08/28/2013 14.7* 15.0 - 23.0 mL/dL Final  . P190 BCR ABL1 08/28/2013 Not Detected   Final   Performed at [facility]  . P210 BCR ABL1 08/28/2013 Not Detected   Final   Performed at [facility]:  FINAL for DONOVIN, KRAEMER ([id number]) Patient: John Mcgrath, John Mcgrath Collected: 08/30/2013 Client: [place] Accession: [id number] Received: 08/30/2013 Farrel Gobble, MD DOB: Oct 09, 1945 Age: 76 Gender: M Reported: 08/30/2013 [address] Patient Ph: [phone] MRN #: [id number] Linna [place] [id number] Visit #: [id number] Chart #: Phone: Fax: CC: FLOW CYTOMETRY  REPORT INTERPRETATION Interpretation Peripheral Blood Flow Cytometry - MONOCLONAL B-CELL POPULATION DETECTED. Diagnosis Comment: There is a monoclonal population of B-cell that lack definitive expression of CD5 or CD10. Review of the peripheral smear reveals degenerating atypical lymphocytes, hampering evaluation. The phenotypic profile is non-specific, but is overall consistent with a non-Hodgkin B-cell lymphoproliferative disorder. Clinical  correlation is recommended. Vicente Males MD Pathologist, Electronic Signature (Case signed 08/30/2013) GROSS AND MICROSCOPIC INFORMATION Source Peripheral Blood Flow Cytometry Microscopic Gated population: Flow cytometric immunophenotyping is performed using antibiodies to the antigens listed in the table below. Electronic gates are placed around a cell cluster displaying light scatter properties corresponding to lymphocytes. - Abnormal Cells in gated population: 55 % - Phenotype of Abnormal Cells: CD19, CD20, CD21, CD22, CD23, HLA-Dr, Kappa, CD25, FMC7 1 of 2 FINAL for CONLEY, DELISLE ([id number]) Specimen Table Lymphoid Associated Myeloid Associated Misc. As CD2 neg CD19 pos CD11c neg CD45 ND CD3 neg CD20 pos CD13 ND HLA-DR pos CD4 neg CD21 pos CD14 ND CD10 neg CD5 neg CD22 pos CD15 ND CD56/16 ND CD7 neg CD23 pos CD33 ND ZAP70 ND CD8 neg CD103 neg MPO ND CD34 ND CD25 pos FMC7 pos CD117 ND CD52 ND sKappa pos CD38 ND sLambda neg cKappa ND cLambda ND Gross Received from [facility] are three lavender tubes labeled as PBS to evaluate for lymphocytosis. All controls stained appropriately. The above tests were developed and their performance characteristics determined by the [facility]. They have not been cleared or approved by the U.S. Food and Drug Administration." Report signed from the following location(s) Interpretation performed at [place], [place] [address]. CLIA #:  [id number],   Urinalysis No results found for this basename: colorurine,  appearanceur,  labspec,  phurine,  glucoseu,  hgbur,  bilirubinur,  ketonesur,  proteinur,  urobilinogen,  nitrite,  leukocytesur    RADIOGRAPHIC STUDIES: No results found.  ASSESSMENT:  #1. Secondary polycythemia with pruritus. #2. 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 #3. Chronic obstructive pulmonary disease with resting hypoxemia. #4. History of TIA in the past, no evidence of neurologic deficit at this time. #5. Gout, on chronic there be with allopurinol. #6. Allergic rhinitis. #7. Probable obstructive sleep apnea, never having undergone polysomnography which was ordered at the time of his last visit. #8. Immune thrombocytopenia.   PLAN:  #1. ZAP 70 today. #2. CT scan chest abdomen and pelvis with contrast to rule out lymphoma. #3. Polysomnography. #4. Discontinue smoking. #5. Followup in 2 weeks with CBC.   All questions were answered. The patient knows to call the clinic with any problems, questions or concerns. We can certainly see the patient much sooner if necessary.   I spent 40 minutes counseling the patient face to face. The total time spent in the appointment was 55 minutes.    Farrel Gobble, MD 09/21/2013 10:37 AM  DISCLAIMER:  This note was dictated with voice recognition software.  Similar sounding words can inadvertently be transcribed inaccurately and may not be corrected upon review.

## 2013-09-22 ENCOUNTER — Other Ambulatory Visit (HOSPITAL_COMMUNITY): Payer: Self-pay | Admitting: Hematology and Oncology

## 2013-09-22 DIAGNOSIS — D479 Neoplasm of uncertain behavior of lymphoid, hematopoietic and related tissue, unspecified: Secondary | ICD-10-CM

## 2013-09-23 ENCOUNTER — Encounter: Payer: Self-pay | Admitting: Neurology

## 2013-09-23 ENCOUNTER — Ambulatory Visit: Payer: Medicare Other | Attending: Hematology and Oncology | Admitting: Sleep Medicine

## 2013-09-23 DIAGNOSIS — R0902 Hypoxemia: Secondary | ICD-10-CM

## 2013-09-23 DIAGNOSIS — D751 Secondary polycythemia: Secondary | ICD-10-CM

## 2013-09-23 DIAGNOSIS — D45 Polycythemia vera: Secondary | ICD-10-CM | POA: Insufficient documentation

## 2013-09-23 DIAGNOSIS — J4489 Other specified chronic obstructive pulmonary disease: Secondary | ICD-10-CM | POA: Insufficient documentation

## 2013-09-23 DIAGNOSIS — J449 Chronic obstructive pulmonary disease, unspecified: Secondary | ICD-10-CM | POA: Insufficient documentation

## 2013-09-23 DIAGNOSIS — D72829 Elevated white blood cell count, unspecified: Secondary | ICD-10-CM

## 2013-09-24 NOTE — Addendum Note (Signed)
Addended by: Rito Ehrlich on: 09/24/2013 07:16 AM   Modules accepted: Level of Service

## 2013-09-27 LAB — ZAP-70
Viability (ZAP70): 50 %
ZAP-70: 1 % (ref <10)

## 2013-09-28 ENCOUNTER — Ambulatory Visit (HOSPITAL_COMMUNITY)
Admission: RE | Admit: 2013-09-28 | Discharge: 2013-09-28 | Disposition: A | Discharge status: Home / Self Care | Payer: Medicare Other | Source: Ambulatory Visit | Attending: Hematology and Oncology | Admitting: Hematology and Oncology

## 2013-09-28 ENCOUNTER — Other Ambulatory Visit (HOSPITAL_COMMUNITY): Payer: Medicare Other

## 2013-09-28 DIAGNOSIS — C9110 Chronic lymphocytic leukemia of B-cell type not having achieved remission: Secondary | ICD-10-CM | POA: Insufficient documentation

## 2013-09-28 DIAGNOSIS — N289 Disorder of kidney and ureter, unspecified: Secondary | ICD-10-CM | POA: Insufficient documentation

## 2013-09-28 DIAGNOSIS — R599 Enlarged lymph nodes, unspecified: Secondary | ICD-10-CM | POA: Insufficient documentation

## 2013-09-28 MED ORDER — IOHEXOL 300 MG/ML  SOLN
100.0000 mL | Freq: Once | INTRAMUSCULAR | Status: AC | PRN
  Administered 2013-09-28: 100 mL via INTRAVENOUS

## 2013-10-05 ENCOUNTER — Encounter (HOSPITAL_COMMUNITY): Payer: Self-pay

## 2013-10-05 ENCOUNTER — Encounter (HOSPITAL_COMMUNITY): Payer: Medicare Other | Attending: Hematology and Oncology

## 2013-10-05 ENCOUNTER — Encounter (HOSPITAL_COMMUNITY): Payer: Medicare Other

## 2013-10-05 VITALS — BP 163/76 | HR 57 | Resp 20 | Wt 214.0 lb

## 2013-10-05 DIAGNOSIS — N400 Enlarged prostate without lower urinary tract symptoms: Secondary | ICD-10-CM

## 2013-10-05 DIAGNOSIS — N289 Disorder of kidney and ureter, unspecified: Secondary | ICD-10-CM | POA: Insufficient documentation

## 2013-10-05 DIAGNOSIS — D479 Neoplasm of uncertain behavior of lymphoid, hematopoietic and related tissue, unspecified: Secondary | ICD-10-CM

## 2013-10-05 DIAGNOSIS — D751 Secondary polycythemia: Secondary | ICD-10-CM

## 2013-10-05 DIAGNOSIS — D47Z9 Other specified neoplasms of uncertain behavior of lymphoid, hematopoietic and related tissue: Secondary | ICD-10-CM | POA: Insufficient documentation

## 2013-10-05 DIAGNOSIS — D45 Polycythemia vera: Secondary | ICD-10-CM | POA: Insufficient documentation

## 2013-10-05 DIAGNOSIS — N2889 Other specified disorders of kidney and ureter: Secondary | ICD-10-CM

## 2013-10-05 LAB — CBC WITH DIFFERENTIAL/PLATELET
Basophils Absolute: 0 10*3/uL (ref 0.0–0.1)
Basophils Relative: 0 % (ref 0–1)
EOS ABS: 0.2 10*3/uL (ref 0.0–0.7)
Eosinophils Relative: 2 % (ref 0–5)
HCT: 54.6 % — ABNORMAL HIGH (ref 39.0–52.0)
Hemoglobin: 18.7 g/dL — ABNORMAL HIGH (ref 13.0–17.0)
Lymphocytes Relative: 42 % (ref 12–46)
Lymphs Abs: 4.5 10*3/uL — ABNORMAL HIGH (ref 0.7–4.0)
MCH: 32.7 pg (ref 26.0–34.0)
MCHC: 34.2 g/dL (ref 30.0–36.0)
MCV: 95.5 fL (ref 78.0–100.0)
Monocytes Absolute: 0.8 10*3/uL (ref 0.1–1.0)
Monocytes Relative: 7 % (ref 3–12)
Neutro Abs: 5.3 10*3/uL (ref 1.7–7.7)
Neutrophils Relative %: 49 % (ref 43–77)
PLATELETS: 154 10*3/uL (ref 150–400)
RBC: 5.72 MIL/uL (ref 4.22–5.81)
RDW: 13.8 % (ref 11.5–15.5)
WBC: 10.8 10*3/uL — ABNORMAL HIGH (ref 4.0–10.5)

## 2013-10-05 MED ORDER — TAMSULOSIN HCL 0.4 MG PO CAPS: 0.4000 mg | ORAL_CAPSULE | Freq: Every day | ORAL | Status: DC

## 2013-10-05 NOTE — Sleep Study (Signed)
  [place] A. Merlene Laughter, MD     [email or web address]        NOCTURNAL POLYSOMNOGRAM    LOCATION: SLEEP LAB FACILITY: [facility]   PHYSICIAN: Vedanth Sirico A. Merlene Laughter, M.D.   DATE OF STUDY: 09/23/2013.   REFERRING PHYSICIAN: G Formanek.  INDICATIONS: This is a 68 year old who presents with snoring, obesity and insomnia.  MEDICATIONS:  Prior to Admission medications   Medication Sig Start Date End Date Taking? Authorizing Provider  allopurinol (ZYLOPRIM) 100 MG tablet Take 100 mg by mouth daily.    Historical Provider, MD  amLODipine-benazepril (LOTREL) 10-40 MG per capsule Take 1 capsule by mouth daily.    Historical Provider, MD  aspirin 81 MG tablet Take 81 mg by mouth daily.    Historical Provider, MD  indomethacin (INDOCIN) 25 MG capsule Take 25 mg by mouth 3 (three) times daily as needed. For gout    Historical Provider, MD  loratadine (CLARITIN) 10 MG tablet Take 10 mg by mouth daily.    Historical Provider, MD  metoprolol succinate (TOPROL-XL) 100 MG 24 hr tablet Take 100 mg by mouth daily. Take with or immediately following a meal.    Historical Provider, MD  simvastatin (ZOCOR) 20 MG tablet Take 20 mg by mouth daily.    Historical Provider, MD  tamsulosin (FLOMAX) 0.4 MG CAPS capsule Take 1 capsule (0.4 mg total) by mouth daily after supper. 10/05/13   Farrel Gobble, MD  tiotropium (SPIRIVA) 18 MCG inhalation capsule Place 18 mcg into inhaler and inhale daily.    Historical Provider, MD      EPWORTH SLEEPINESS SCALE: 2.   BMI: 31.   ARCHITECTURAL SUMMARY: Total recording time was 438 minutes. Sleep efficiency 59 %. Sleep latency 11 minutes. REM latency 255 minutes. Stage NI 3 %, N2 29 % and N3 36 % and REM sleep 32 %.    RESPIRATORY DATA:  Baseline oxygen saturation is 85 %. The lowest saturation is 66 %. The diagnostic AHI is 16. The RDI is 16. The REM AHI is 40. They were prolonged episodes of hypo-ventilation and desaturation without obstructive events.  The total minutes spent below 88% is 368 minutes.  LIMB MOVEMENT SUMMARY: PLM index 31.   ELECTROCARDIOGRAM SUMMARY: Average heart rate is 64 with no significant dysrhythmias observed.   IMPRESSION:  1. Moderate obstructive sleep apnea syndrome. Formal CPAP titration study is suggested. 2. Hypoventilation syndrome. 3. 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 4. Abnormal sleep architecture with reduced sleep efficiency.  Thanks for this referral.  Chundra Sauerwein A. Merlene Laughter, M.D. Diplomat, Tax adviser of Sleep Medicine.

## 2013-10-05 NOTE — Patient Instructions (Addendum)
[facility] Discharge Instructions  RECOMMENDATIONS MADE BY THE CONSULTANT AND ANY TEST RESULTS WILL BE SENT TO YOUR REFERRING PHYSICIAN.  EXAM FINDINGS BY THE PHYSICIAN TODAY AND SIGNS OR SYMPTOMS TO REPORT TO CLINIC OR PRIMARY PHYSICIAN: Exam and findings as discussed by Dr. Barnet Glasgow.  MEDICATIONS PRESCRIBED:   Flomax prescription has been sent to your pharmacy.  INSTRUCTIONS/FOLLOW-UP:  Return in 2 weeks for follow-up visit with Dr. Barnet Glasgow. MRI of your abdomen and pelvis has been scheduled for Friday, May 15th at 8:00 AM (please arrive at 7:45 AM).  YOU SHOULD HAVE NOTHING TO EAT OR DRINK AFTER MIDNIGHT THE NIGHT BEFORE THE MRI.   Thank you for choosing [facility] to provide your oncology and hematology care.  To afford each patient quality time with our providers, please arrive at least 15 minutes before your scheduled appointment time.  With your help, our goal is to use those 15 minutes to complete the necessary work-up to ensure our physicians have the information they need to help with your evaluation and healthcare recommendations.    Effective January 1st, 2014, we ask that you re-schedule your appointment with our physicians should you arrive 10 or more minutes late for your appointment.  We strive to give you quality time with our providers, and arriving late affects you and other patients whose appointments are after yours.    Again, thank you for choosing [facility].  Our hope is that these requests will decrease the amount of time that you wait before being seen by our physicians.       _____________________________________________________________  Should you have questions after your visit to [facility], please contact our office at [phone] between the hours of 8:30 a.m. and 5:00 p.m.  Voicemails left after 4:30 p.m. will not be returned until the following business day.  For prescription refill requests,  have your pharmacy contact our office with your prescription refill request.

## 2013-10-05 NOTE — Progress Notes (Signed)
[facility]  OFFICE PROGRESS NOTE  Velta Addison, Claretha Cooper, DO [address] 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 Follow-up  . 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 Hermenia Fiscal. May with renal abnormality    CURRENT THERAPY: Undergoing workup for peripheral blood lymphoproliferative disorder on flow cytometry and for discussion of recent CT scan and polysomnography.  INTERVAL HISTORY: John Mcgrath 68 y.o. male returns for followup and discussion of recent imaging and polysomnography. Although the sleep study has been done, results has not yet been published. He is complaining of nocturia x3 with urinary hesitancy but without hematuria or incontinence. He is smoking about half a pack of cigarettes daily which is considerably less than he had in the past. He denies any fever, night sweats, abdominal pain, but does have lower 70 swelling which dissipates overnight. Chronic cough without expectoration or hemoptysis. No epistaxis, melena, hematochezia, skin rash, headache, or seizures.  MEDICAL HISTORY: Past Medical History  Diagnosis Date  . Hypertension   . Gout   . COPD (chronic obstructive pulmonary disease)   . TIA (transient ischemic attack) 20 y ago  . Cataract     starting to form per pt per eye md  . Borderline diabetic   . High cholesterol   . Polycythemia   . 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    ALLERGIES:  has No Known Allergies.  MEDICATIONS: has a current  medication list which includes the following prescription(s): allopurinol, amlodipine-benazepril, aspirin, indomethacin, loratadine, metoprolol succinate, simvastatin, tiotropium, and tamsulosin.  SURGICAL HISTORY:  Past Surgical History  Procedure Laterality Date  . Hernia repair  @ 68 years of age & then again @ 68 years of age    FAMILY HISTORY: family history is not on file.  SOCIAL HISTORY:  reports that he has been smoking.  He has never used smokeless tobacco. He reports that he drinks about 12.6 ounces of alcohol per week. He reports that he does not use illicit drugs.  REVIEW OF SYSTEMS:  Other than that discussed above is noncontributory.  PHYSICAL EXAMINATION: ECOG PERFORMANCE STATUS: 1 - Symptomatic but completely ambulatory  Blood pressure 163/76, pulse 57, resp. rate 20, weight 214 lb (97.07 kg), SpO2 90.00%.  GENERAL:alert, no distress and comfortable. Moderately obese. SKIN: skin color, texture, turgor are normal, no rashes or significant lesions EYES: PERLA; Conjunctiva are pink and non-injected, sclera clear SINUSES: No redness or tenderness over maxillary or ethmoid sinuses OROPHARYNX:no exudate, no erythema on lips, buccal mucosa, or tongue. NECK: supple, thyroid normal size, non-tender, without nodularity. No masses CHEST: Increased AP diameter with no gynecomastia. LYMPH:  no palpable lymphadenopathy in the cervical, axillary or inguinal LUNGS: clear to auscultation and percussion with normal breathing effort HEART: regular rate & rhythm and no murmurs. ABDOMEN:abdomen soft, non-tender and normal bowel sounds MUSCULOSKELETAL:no cyanosis of digits and no clubbing. Range of motion normal. +1 lower 70 edema NEURO: alert & oriented x 3 with fluent speech, no focal motor/sensory deficits   LABORATORY DATA: Appointment on 10/05/2013  Component Date Value Ref Range Status  .  WBC 10/05/2013 10.8* 4.0 - 10.5 K/uL Final  . RBC 10/05/2013 5.72  4.22 - 5.81 MIL/uL Final    . Hemoglobin 10/05/2013 18.7* 13.0 - 17.0 g/dL Final  . HCT 10/05/2013 54.6* 39.0 - 52.0 % Final  . MCV 10/05/2013 95.5  78.0 - 100.0 fL Final  . MCH 10/05/2013 32.7  26.0 - 34.0 pg Final  . MCHC 10/05/2013 34.2  30.0 - 36.0 g/dL Final  . RDW 10/05/2013 13.8  11.5 - 15.5 % Final  . Platelets 10/05/2013 154  150 - 400 K/uL Final  . Neutrophils Relative % 10/05/2013 49  43 - 77 % Final  . Neutro Abs 10/05/2013 5.3  1.7 - 7.7 K/uL Final  . Lymphocytes Relative 10/05/2013 42  12 - 46 % Final  . Lymphs Abs 10/05/2013 4.5* 0.7 - 4.0 K/uL Final  . Monocytes Relative 10/05/2013 7  3 - 12 % Final  . Monocytes Absolute 10/05/2013 0.8  0.1 - 1.0 K/uL Final  . Eosinophils Relative 10/05/2013 2  0 - 5 % Final  . Eosinophils Absolute 10/05/2013 0.2  0.0 - 0.7 K/uL Final  . Basophils Relative 10/05/2013 0  0 - 1 % Final  . Basophils Absolute 10/05/2013 0.0  0.0 - 0.1 K/uL Final  Office Visit on 09/21/2013  Component Date Value Ref Range Status  . ZAP-70 09/21/2013 1  <10 % Final   Comment: (NOTE)                          The immunophenotype is atypical for B-CLL. ZAP-70 is                          correlated with outcome only in CLL.                          In CLL, ZAP-70 positivity in <10% of the B cells has                          been correlated with favorable prognostic features                          including longer time-to-treatment. These results                          should be interpreted in context of other prognostic                          tests, which may not always be fully concordant.                          This test was developed and its performance                          characteristics have been determined by [facility], [place], [place].                          It has not been cleared or approved by the U.S.  Food and Drug Administration. The FDA has determined                          that such  clearance or approval is not necessary.                          Performance characteristics refer to the analytical                          performance of the test.  . Source (ZAP70) 09/21/2013 Not Provided   Final  . Viability (ZAP70) 09/21/2013 50   Final   Performed at [facility]  . WBC 09/21/2013 11.5* 4.0 - 10.5 K/uL Final  . RBC 09/21/2013 5.85* 4.22 - 5.81 MIL/uL Final  . Hemoglobin 09/21/2013 18.3* 13.0 - 17.0 g/dL Final  . HCT 09/21/2013 54.8* 39.0 - 52.0 % Final  . MCV 09/21/2013 93.7  78.0 - 100.0 fL Final  . MCH 09/21/2013 31.3  26.0 - 34.0 pg Final  . MCHC 09/21/2013 33.4  30.0 - 36.0 g/dL Final  . RDW 09/21/2013 13.7  11.5 - 15.5 % Final  . Platelets 09/21/2013 138* 150 - 400 K/uL Final  . Neutrophils Relative % 09/21/2013 53  43 - 77 % Final  . Neutro Abs 09/21/2013 6.1  1.7 - 7.7 K/uL Final  . Lymphocytes Relative 09/21/2013 37  12 - 46 % Final  . Lymphs Abs 09/21/2013 4.2* 0.7 - 4.0 K/uL Final  . Monocytes Relative 09/21/2013 8  3 - 12 % Final  . Monocytes Absolute 09/21/2013 0.9  0.1 - 1.0 K/uL Final  . Eosinophils Relative 09/21/2013 2  0 - 5 % Final  . Eosinophils Absolute 09/21/2013 0.3  0.0 - 0.7 K/uL Final  . Basophils Relative 09/21/2013 0  0 - 1 % Final  . Basophils Absolute 09/21/2013 0.1  0.0 - 0.1 K/uL Final  Hospital Outpatient Visit on 09/19/2013  Component Date Value Ref Range Status  . FVC-Pre 09/19/2013 2.23   Final  . FVC-%Pred-Pre 09/19/2013 50   Final  . FVC-Post 09/19/2013 2.57   Final  . FVC-%Pred-Post 09/19/2013 58   Final  . FVC-%Change-Post 09/19/2013 15   Final  . FEV1-Pre 09/19/2013 0.87   Final  . FEV1-%Pred-Pre 09/19/2013 26   Final  . FEV1-Post 09/19/2013 1.04   Final  . FEV1-%Pred-Post 09/19/2013 31   Final  . FEV1-%Change-Post 09/19/2013 19   Final  . FEV6-Pre 09/19/2013 2.06   Final  . FEV6-%Pred-Pre 09/19/2013 49   Final  . FEV6-Post 09/19/2013 2.35   Final  . FEV6-%Pred-Post 09/19/2013 56   Final  .  FEV6-%Change-Post 09/19/2013 14   Final  . Pre FEV1/FVC ratio 09/19/2013 39   Final  . FEV1FVC-%Pred-Pre 09/19/2013 52   Final  . Post FEV1/FVC ratio 09/19/2013 41   Final  . FEV1FVC-%Change-Post 09/19/2013 3   Final  . Pre FEV6/FVC Ratio 09/19/2013 92   Final  . FEV6FVC-%Pred-Pre 09/19/2013 97   Final  . Post FEV6/FVC ratio 09/19/2013 92   Final  . FEV6FVC-%Pred-Post 09/19/2013 96   Final  . FEV6FVC-%Change-Post 09/19/2013 0   Final  . FEF 25-75 Pre 09/19/2013 0.36   Final  . FEF2575-%Pred-Pre 09/19/2013 13   Final  . FEF 25-75 Post 09/19/2013 0.49   Final  . FEF2575-%Pred-Post 09/19/2013 18   Final  . FEF2575-%Change-Post 09/19/2013 36   Final  .  RV 09/19/2013 4.56   Final  . RV % pred 09/19/2013 198   Final  . TLC 09/19/2013 7.40   Final  . TLC % pred 09/19/2013 108   Final  . DLCO unc 09/19/2013 17.79   Final  . DLCO unc % pred 09/19/2013 57   Final  . DLCO cor 09/19/2013 17.79   Final  . DLCO cor % pred 09/19/2013 57   Final  . DL/VA 09/19/2013 3.79   Final  . 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 Patient Ph: [phone] MRN #: [id number] Linna Hoff [place] [address] Visit #: [id number] Chart #: Phone: Fax: CC: FLOW CYTOMETRY REPORT INTERPRETATION Interpretation Peripheral Blood Flow Cytometry - MONOCLONAL B-CELL POPULATION DETECTED. Diagnosis Comment: There is a monoclonal population of B-cell that lack definitive expression of CD5 or CD10. Review of the peripheral smear reveals degenerating atypical lymphocytes, hampering evaluation. The phenotypic profile is non-specific, but is overall consistent with a non-Hodgkin B-cell lymphoproliferative  disorder. Clinical correlation is recommended. Vicente Males MD Pathologist, Electronic Signature (Case signed 08/30/2013) GROSS AND MICROSCOPIC INFORMATION Source Peripheral Blood Flow Cytometry Microscopic Gated population: Flow cytometric immunophenotyping is performed using antibiodies to the antigens listed in the table below. Electronic gates are placed around a cell cluster displaying light scatter properties corresponding to lymphocytes. - Abnormal Cells in gated population: 55 % - Phenotype of Abnormal Cells: CD19, CD20, CD21, CD22, CD23, HLA-Dr, Kappa, CD25, FMC7 1 of 2 FINAL for MARIE, CHOW ([id number]) Specimen Table Lymphoid Associated Myeloid Associated Misc. As CD2 neg CD19 pos CD11c neg CD45 ND CD3 neg CD20 pos CD13 ND HLA-DR pos CD4 neg CD21 pos CD14 ND CD10 neg CD5 neg CD22 pos CD15 ND CD56/16 ND CD7 neg CD23 pos CD33 ND ZAP70 ND CD8 neg CD103 neg MPO ND CD34 ND CD25 pos FMC7 pos CD117 ND CD52 ND sKappa pos CD38 ND sLambda neg cKappa ND cLambda ND Gross Received from [facility] are three lavender tubes labeled as PBS to evaluate for lymphocytosis. All controls stained appropriately. The above tests were developed and their performance characteristics determined by the [facility]. They have not been cleared or approved by the U.S. Food and Drug Administration." Report signed from the following location(s) Interpretation performed at [facility], [place], [place] [address]. CLIA #: [id number], Urinalysis No results found for this basename: colorurine,  appearanceur,  labspec,  phurine,  glucoseu,  hgbur,  bilirubinur,  ketonesur,  proteinur,  urobilinogen,  nitrite,  leukocytesur    RADIOGRAPHIC STUDIES: Ct Chest W Contrast  09/28/2013   CLINICAL DATA:  Chronic lymphocytic leukemia. Evaluate for lymphoma.  EXAM: CT CHEST, ABDOMEN, AND PELVIS WITH CONTRAST  TECHNIQUE: Multidetector CT imaging of the chest, abdomen and  pelvis was performed following the standard protocol during bolus administration of intravenous contrast.  CONTRAST:  134m OMNIPAQUE IOHEXOL 300 MG/ML  SOLN  COMPARISON:  None.  FINDINGS: CT CHEST FINDINGS  No axillary supraclavicular lymphadenopathy. Multiple nodules in the left lobe of thyroid gland largest measuring 2.2 cm. No mediastinal hilar lymphadenopathy. Small paratracheal lymph nodes are borderline enlarged by CT size criteria measuring 9 to 11 mm. No pericardial fluid. Coronary calcifications are present. Esophagus is normal.  Mild atelectasis at the right lung base. No suspicious nodules. Airways are normal.  CT ABDOMEN AND PELVIS FINDINGS  Small hypodensity in the anterior left hepatic lobe measuring 4 mm is too small to characterize and likely represent benign cysts. Smaller lesions in the dome of the liver. Gallbladder is normal. No biliary duct dilatation. Pancreas is normal. The spleen is normal volume. Adrenal glands are normal.  This is a high-density lesion extending from the superior aspect of the right kidney which measures 24 m. This lesion does have decreased density on the delayed scan suggesting washout and therefore is concerning for enhancing lesion. Additional left and right hypodense lesions which have simple fluid attenuation are likely represent benign cysts.  The stomach, small bowel, appendix, and cecum normal. The colon and rectosigmoid colon are normal.  Abdominal or is normal caliber. No retroperitoneal periportal lymphadenopathy.  No free fluid the pelvis. Prostate gland is normal. No pelvic lymphadenopathy. No abdominal adenopathy or mesenteric adenopathy. No inguinal adenopathy.  Prostate gland and bladder normal. No pelvic lymphadenopathy. No aggressive osseous lesion.  IMPRESSION: 1. No evidence of up lymphoma in the chest, abdomen, or pelvis. There are small mediastinal lymph nodes which were borderline enlarged. Spleen is normal volume. 2. High-density right renal lesion  with differential including enhancing solid neoplasm versus hyperdense cyst. Recommend MRI with without contrast for further evaluation. These results will be called to the ordering clinician or representative by the Radiologist Assistant, and communication documented in the PACS Dashboard.   Electronically Signed   By: Genevive Bi M.D.   On: 09/28/2013 11:48   Ct Abdomen Pelvis W Contrast  09/28/2013   CLINICAL DATA:  Chronic lymphocytic leukemia. Evaluate for lymphoma.  EXAM: CT CHEST, ABDOMEN, AND PELVIS WITH CONTRAST  TECHNIQUE: Multidetector CT imaging of the chest, abdomen and pelvis was performed following the standard protocol during bolus administration of intravenous contrast.  CONTRAST:  OMNIPAQUE IOHEXOL 300 MG/ML  SOLN  COMPARISON:  None.  FINDINGS: CT CHEST FINDINGS  No axillary supraclavicular lymphadenopathy. Multiple nodules in the left lobe of thyroid gland largest measuring 2.2 cm. No mediastinal hilar lymphadenopathy. Small paratracheal lymph nodes are borderline enlarged by CT size criteria measuring 9 to 11 mm. No pericardial fluid. Coronary calcifications are present. Esophagus is normal.  Mild atelectasis at the right lung base. No suspicious nodules. Airways are normal.  CT ABDOMEN AND PELVIS FINDINGS  Small hypodensity in the anterior left hepatic lobe measuring 4 mm is too small to characterize and likely represent benign cysts. Smaller lesions in the dome of the liver. Gallbladder is normal. No biliary duct dilatation. Pancreas is normal. The spleen is normal volume. Adrenal glands are normal.  This is a high-density lesion extending from the superior aspect of the right kidney which measures 24 m. This lesion does have decreased density on the delayed scan suggesting washout and therefore is concerning for enhancing lesion. Additional left and right hypodense lesions which have simple fluid attenuation are likely represent benign cysts.  The stomach, small bowel, appendix,  and cecum normal. The colon and rectosigmoid colon are normal.  Abdominal or is normal caliber. No retroperitoneal periportal lymphadenopathy.  No free fluid the pelvis. Prostate gland is normal. No pelvic lymphadenopathy. No abdominal adenopathy or mesenteric adenopathy. No inguinal adenopathy.  Prostate gland and bladder normal. No pelvic lymphadenopathy. No aggressive osseous lesion.  IMPRESSION: 1. No evidence of up lymphoma in the chest, abdomen, or pelvis. There are small mediastinal lymph nodes which were borderline enlarged.  Spleen is normal volume. 2. High-density right renal lesion with differential including enhancing solid neoplasm versus hyperdense cyst. Recommend MRI with without contrast for further evaluation. These results will be called to the ordering clinician or representative by the Radiologist Assistant, and communication documented in the PACS Dashboard.   Electronically Signed   By: Suzy Bouchard M.D.   On: 09/28/2013 11:48    ASSESSMENT:  #1. Peripheral blood lymphoproliferative disorder with no evidence of lymphadenopathy on CT scan, ZAP- 70 negative #2. Polycythemia probably secondary to chronic obstructive pulmonary disease. #3. Right renal lesion, possible tumor, possibly contributing to polycythemia if in fact renal cell carcinoma is present. This is in line with excess erythropoietin production. #4. Chronic obstructive pulmonary disease, , severe on PFTs. #5. Possible obstructive sleep apnea syndrome, awaiting polysomnography report. #6. Benign prostatic hypertrophy, symptomatic. #7. Allergic rhinitis, on treatment. #8. Immune thrombocytopenia #9. History of TIA in the past with no evidence of neurologic deficit at this time.   PLAN:  #1. Erythropoietin level today. Renal cell carcinomas and benign cysts can over produce erythropoietin resulting in the possible contribution to the patient's polycythemia.  #2. MRI of the abdomen and pelvis without contrast as  recommended by radiologist who interpreted is CAT scan imaging. #3. Flomax 0.4 mg at bedtime. #4. Followup in 2 weeks with CBC. Continue to try to decrease smoking.   All questions were answered. The patient knows to call the clinic with any problems, questions or concerns. We can certainly see the patient much sooner if necessary.   I spent 30 minutes counseling the patient face to face. The total time spent in the appointment was 40 minutes.    Farrel Gobble, MD 10/05/2013 10:13 AM  DISCLAIMER:  This note was dictated with voice recognition software.  Similar sounding words can inadvertently be transcribed inaccurately and may not be corrected upon review.

## 2013-10-08 LAB — ERYTHROPOIETIN: ERYTHROPOIETIN: 16.6 m[IU]/mL (ref 2.6–18.5)

## 2013-10-09 ENCOUNTER — Telehealth (HOSPITAL_COMMUNITY): Payer: Self-pay | Admitting: Hematology and Oncology

## 2013-10-09 ENCOUNTER — Other Ambulatory Visit (HOSPITAL_COMMUNITY): Payer: Self-pay | Admitting: Hematology and Oncology

## 2013-10-09 DIAGNOSIS — G4733 Obstructive sleep apnea (adult) (pediatric): Secondary | ICD-10-CM

## 2013-10-09 NOTE — Telephone Encounter (Signed)
See telephone message

## 2013-10-10 ENCOUNTER — Other Ambulatory Visit (HOSPITAL_COMMUNITY): Payer: Medicare Other

## 2013-10-12 ENCOUNTER — Ambulatory Visit (HOSPITAL_COMMUNITY)
Admission: RE | Admit: 2013-10-12 | Discharge: 2013-10-12 | Disposition: A | Discharge status: Home / Self Care | Payer: Medicare Other | Source: Ambulatory Visit | Attending: Hematology and Oncology | Admitting: Hematology and Oncology

## 2013-10-12 ENCOUNTER — Inpatient Hospital Stay (HOSPITAL_COMMUNITY): Admission: RE | Admit: 2013-10-12 | Payer: Medicare Other | Source: Ambulatory Visit

## 2013-10-12 ENCOUNTER — Other Ambulatory Visit (HOSPITAL_COMMUNITY): Payer: Self-pay | Admitting: Hematology and Oncology

## 2013-10-12 ENCOUNTER — Encounter (HOSPITAL_COMMUNITY): Payer: Self-pay

## 2013-10-12 DIAGNOSIS — K869 Disease of pancreas, unspecified: Secondary | ICD-10-CM | POA: Insufficient documentation

## 2013-10-12 DIAGNOSIS — N2889 Other specified disorders of kidney and ureter: Secondary | ICD-10-CM

## 2013-10-12 DIAGNOSIS — N289 Disorder of kidney and ureter, unspecified: Secondary | ICD-10-CM | POA: Insufficient documentation

## 2013-10-12 LAB — POCT I-STAT CREATININE: Creatinine, Ser: 1.2 mg/dL (ref 0.50–1.35)

## 2013-10-12 MED ORDER — GADOBENATE DIMEGLUMINE 529 MG/ML IV SOLN
20.0000 mL | Freq: Once | INTRAVENOUS | Status: AC | PRN
  Administered 2013-10-12: 20 mL via INTRAVENOUS

## 2013-10-12 MED ORDER — SODIUM CHLORIDE 0.9 % IV SOLN
INTRAVENOUS | Status: AC
  Filled 2013-10-12: qty 50

## 2013-10-12 MED ORDER — GADOBENATE DIMEGLUMINE 529 MG/ML IV SOLN: 20.0000 mL | Freq: Once | INTRAVENOUS | Status: DC | PRN

## 2013-10-15 ENCOUNTER — Telehealth (HOSPITAL_COMMUNITY): Payer: Self-pay | Admitting: Hematology and Oncology

## 2013-10-15 ENCOUNTER — Other Ambulatory Visit (HOSPITAL_COMMUNITY): Payer: Self-pay | Admitting: Hematology and Oncology

## 2013-10-15 DIAGNOSIS — G4730 Sleep apnea, unspecified: Secondary | ICD-10-CM

## 2013-10-15 NOTE — Telephone Encounter (Signed)
See telephone note.

## 2013-10-18 ENCOUNTER — Ambulatory Visit (HOSPITAL_COMMUNITY): Payer: Medicare Other

## 2013-10-19 ENCOUNTER — Ambulatory Visit (HOSPITAL_COMMUNITY): Payer: Medicare Other

## 2013-10-26 NOTE — Progress Notes (Signed)
Labs drawn

## 2013-11-02 ENCOUNTER — Other Ambulatory Visit (HOSPITAL_COMMUNITY): Payer: Self-pay

## 2013-11-02 ENCOUNTER — Encounter (HOSPITAL_COMMUNITY): Payer: Self-pay

## 2013-11-02 ENCOUNTER — Encounter (HOSPITAL_COMMUNITY): Payer: Medicare Other | Attending: Hematology and Oncology

## 2013-11-02 VITALS — BP 155/84 | HR 62 | Temp 98.0°F | Resp 20 | Wt 213.8 lb

## 2013-11-02 DIAGNOSIS — D693 Immune thrombocytopenic purpura: Secondary | ICD-10-CM

## 2013-11-02 DIAGNOSIS — N289 Disorder of kidney and ureter, unspecified: Secondary | ICD-10-CM

## 2013-11-02 DIAGNOSIS — G4730 Sleep apnea, unspecified: Secondary | ICD-10-CM

## 2013-11-02 DIAGNOSIS — F17200 Nicotine dependence, unspecified, uncomplicated: Secondary | ICD-10-CM

## 2013-11-02 DIAGNOSIS — D751 Secondary polycythemia: Secondary | ICD-10-CM

## 2013-11-02 DIAGNOSIS — D47Z9 Other specified neoplasms of uncertain behavior of lymphoid, hematopoietic and related tissue: Secondary | ICD-10-CM | POA: Insufficient documentation

## 2013-11-02 DIAGNOSIS — D479 Neoplasm of uncertain behavior of lymphoid, hematopoietic and related tissue, unspecified: Secondary | ICD-10-CM

## 2013-11-02 DIAGNOSIS — N281 Cyst of kidney, acquired: Secondary | ICD-10-CM

## 2013-11-02 DIAGNOSIS — G4733 Obstructive sleep apnea (adult) (pediatric): Secondary | ICD-10-CM

## 2013-11-02 DIAGNOSIS — J449 Chronic obstructive pulmonary disease, unspecified: Secondary | ICD-10-CM

## 2013-11-02 DIAGNOSIS — D6959 Other secondary thrombocytopenia: Secondary | ICD-10-CM | POA: Insufficient documentation

## 2013-11-02 NOTE — Progress Notes (Signed)
[facility]  OFFICE PROGRESS NOTE  Velta Addison, Claretha Cooper, DO [address] 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 B-cell lymphoproliferative disorder  . Secondary polycythemia  . Multiple renal cysts, some hemorrhagic  . Drugs of sleep apnea syndrome    CURRENT THERAPY: Watchful expectation after discovery of peripheral blood lymphoproliferative disorder, non-Hodgkin's B cell manifesting as atypical lymphocytes with no definite expression of CD5 or CD10.  INTERVAL HISTORY: John Mcgrath 68 y.o. male returns for after recent MRI of the abdomen to further characterize abnormality seen on CT scan of the kidneys. Patient was also found to have secondary polycythemia in addition to obstructive sleep apnea, chronic obstructive pulmonary disease, and immune thrombocytopenia. He will have a repeat sleep study in early July and noted to optimize oxygen and CPAP pressure requirements. He denies any hematuria, worsening cough, sore throat, epistaxis, does have watery eyes without earache, epistaxis, hemoptysis, melena, hematochezia, hematuria, easy satiety, lower extremity swelling or redness, skin rash, headache, or seizures.  MEDICAL HISTORY: Past Medical History  Diagnosis Date  . Hypertension   . Gout   . COPD (chronic obstructive pulmonary disease)   . TIA (transient ischemic attack) 20 y ago  . Cataract     starting to form per pt per eye md    . Borderline diabetic   . High cholesterol   . Polycythemia   . Leukocytosis     INTERIM HISTORY: has Leukocytosis; Polycythemia; Chronic obstructive pulmonary disease; Hypoxia; Chronic lymphocytic leukemia; Thrombocytopenia, immune; and Obstructive sleep apnea on his problem list.    ALLERGIES:  has No Known Allergies.  MEDICATIONS: has a current medication list which includes the following prescription(s): amlodipine-benazepril, aspirin, indomethacin, loratadine, metoprolol succinate, simvastatin, tamsulosin, tiotropium, and allopurinol.  SURGICAL HISTORY:  Past Surgical History  Procedure Laterality Date  . Hernia repair  @ 68 years of age & then again @ 68 years of age    FAMILY HISTORY: family history is not on file.  SOCIAL HISTORY:  reports that he has been smoking.  He has never used smokeless tobacco. He reports that he drinks about 12.6 ounces of alcohol per week. He reports that he does not use illicit drugs.  REVIEW OF SYSTEMS:  Other than that discussed above is noncontributory.  PHYSICAL EXAMINATION: ECOG PERFORMANCE STATUS: 1 - Symptomatic but completely ambulatory  Blood pressure 155/84, pulse 62, temperature 98 F (36.7 C), temperature source Oral, resp. rate 20, weight 213 lb 12.8 oz (96.979 kg), SpO2 91.00%.  GENERAL:alert, no distress and comfortable SKIN: skin color, texture, turgor are normal, no rashes or significant lesions EYES: PERLA; Conjunctiva injected bilaterally with no exudate. SINUSES: No redness or tenderness over maxillary or ethmoid sinuses OROPHARYNX:no exudate, no erythema on lips, buccal mucosa, or tongue. NECK: supple, thyroid normal size, non-tender, without nodularity. No masses CHEST: Increased AP diameter with no breast masses. LYMPH:  no palpable lymphadenopathy in the cervical, axillary or inguinal LUNGS: clear to auscultation and percussion with normal breathing effort HEART: regular rate & rhythm and no murmurs. ABDOMEN:abdomen  soft,  non-tender and normal bowel sounds MUSCULOSKELETAL:no cyanosis of digits and no clubbing. Range of motion normal.  NEURO: alert & oriented x 3 with fluent speech, no focal motor/sensory deficits   LABORATORY DATA: Hospital Outpatient Visit on 10/12/2013  Component Date Value Ref Range Status  . Creatinine, Ser 10/12/2013 1.20  0.50 - 1.35 mg/dL Final  Office Visit on 10/05/2013  Component Date Value Ref Range Status  . Erythropoietin 10/05/2013 16.6  2.6 - 18.5 mIU/mL Final   Performed at [facility] on 10/05/2013  Component Date Value Ref Range Status  . WBC 10/05/2013 10.8* 4.0 - 10.5 K/uL Final  . RBC 10/05/2013 5.72  4.22 - 5.81 MIL/uL Final  . Hemoglobin 10/05/2013 18.7* 13.0 - 17.0 g/dL Final  . HCT 10/05/2013 54.6* 39.0 - 52.0 % Final  . MCV 10/05/2013 95.5  78.0 - 100.0 fL Final  . MCH 10/05/2013 32.7  26.0 - 34.0 pg Final  . MCHC 10/05/2013 34.2  30.0 - 36.0 g/dL Final  . RDW 10/05/2013 13.8  11.5 - 15.5 % Final  . Platelets 10/05/2013 154  150 - 400 K/uL Final  . Neutrophils Relative % 10/05/2013 49  43 - 77 % Final  . Neutro Abs 10/05/2013 5.3  1.7 - 7.7 K/uL Final  . Lymphocytes Relative 10/05/2013 42  12 - 46 % Final  . Lymphs Abs 10/05/2013 4.5* 0.7 - 4.0 K/uL Final  . Monocytes Relative 10/05/2013 7  3 - 12 % Final  . Monocytes Absolute 10/05/2013 0.8  0.1 - 1.0 K/uL Final  . Eosinophils Relative 10/05/2013 2  0 - 5 % Final  . Eosinophils Absolute 10/05/2013 0.2  0.0 - 0.7 K/uL Final  . Basophils Relative 10/05/2013 0  0 - 1 % Final  . Basophils Absolute 10/05/2013 0.0  0.0 - 0.1 K/uL Final    PATHOLOGY:  for MAHAMED, ZALEWSKI ([id number]) Patient: AKRAM, KISSICK Collected: 08/30/2013 Client: [facility] Accession: [id number] Received: 08/30/2013 Farrel Gobble, MD DOB: 1946-04-28 Age: 6 Gender: M Reported: 08/30/2013 [address] Patient Ph: [phone] MRN #: [id number] Linna Hoff [place] [id number] Visit #: [id number] Chart #:  Phone: Fax: CC: FLOW CYTOMETRY REPORT INTERPRETATION Interpretation Peripheral Blood Flow Cytometry - MONOCLONAL B-CELL POPULATION DETECTED. Diagnosis Comment: There is a monoclonal population of B-cell that lack definitive expression of CD5 or CD10. Review of the peripheral smear reveals degenerating atypical lymphocytes, hampering evaluation. The phenotypic profile is non-specific, but is overall consistent with a non-Hodgkin B-cell lymphoproliferative disorder. Clinical correlation is recommended. Vicente Males MD Pathologist, Electronic Signature (Case signed 08/30/2013) GROSS AND MICROSCOPIC INFORMATION Source Peripheral Blood Flow Cytometry Microscopic Gated population: Flow cytometric immunophenotyping is performed using antibiodies to the antigens listed in the table below. Electronic gates are placed around a cell cluster displaying light scatter properties corresponding to lymphocytes. - Abnormal Cells in gated population: 55 % - Phenotype of Abnormal Cells: CD19, CD20, CD21, CD22,    Urinalysis No results found for this basename: colorurine,  appearanceur,  labspec,  phurine,  glucoseu,  hgbur,  bilirubinur,  ketonesur,  proteinur,  urobilinogen,  nitrite,  leukocytesur    RADIOGRAPHIC STUDIES: Dg Eye Foreign Body  10/12/2013   CLINICAL DATA:  Metal working/exposure; clearance prior to MRI  EXAM: ORBITS FOR FOREIGN BODY - 2 VIEW  COMPARISON:  None.  FINDINGS: There is no evidence of metallic foreign body within the orbits. No significant bone abnormality identified.  IMPRESSION: No evidence of metallic foreign body within the orbits.  Electronically Signed   By: Daryll Brod M.D.   On: 10/12/2013 08:59   Mr Abdomen W Wo Contrast  10/12/2013   CLINICAL DATA:  Evaluate right renal lesions noted on recent CT examination.  EXAM: MRI ABDOMEN WITHOUT AND WITH CONTRAST  TECHNIQUE: Multiplanar multisequence MR imaging of the abdomen was performed both before and after the  administration of intravenous contrast.  CONTRAST:  69mL MULTIHANCE GADOBENATE DIMEGLUMINE 529 MG/ML IV SOLN  COMPARISON:  CT of the chest, abdomen and pelvis 09/28/2013.  FINDINGS: Comment: Study was significantly limited by a large amount of patient respiratory motion (per report from the technologist, the patient was unable to reliably hold his breath during the examination), which results in extensive motion related artifact, and makes accurate assessment for subtle areas of enhancement impossible secondary to lack of subtraction imaging.  There are multiple renal lesions bilaterally. The majority of these lesions are low signal intensity on T1 weighted images, high signal intensity on T2 weighted images, and do not enhance, compatible with simple cysts, the largest of which measures 2.0 cm in diameter in the lateral aspect of the lower pole of the left kidney. Some other lesions demonstrate some dependent T1 hyperintensity and T2 hyperintensity, also without apparent enhancement on post gadolinium images, compatible with minimally complex cysts that have dependent proteinaceous or hemorrhagic contents, largest of which measures 2.1 cm in diameter extending exophytically off the posterior aspect of the interpolar region of the right kidney (image 17 of series 16). Additionally, there are other lesions that are T1 hyperintense, T2 hypointense, and do not demonstrate enhancement on post gadolinium images, compatible with diffusely proteinaceous or hemorrhagic cysts, largest of which is 2.7 cm in diameter extending exophytically off the posterior aspect of the upper pole of the right kidney (image 11 of series 16). No other definite suspicious appearing renal lesions are noted on today's slightly limited examination.  Several scattered subcentimeter lesions in the liver are T1 hypointense, T2 hyperintense and the not enhance, compatible with tiny cysts or biliary hamartomas. There is a tiny 4 mm filling defect  along the nondependent wall of the fundus of the gallbladder best demonstrated on images 19 of series 16 and image 44 of series 102, which demonstrates some subtle enhancement on post gadolinium imaging, most likely to represent a small gallbladder wall polyp. The appearance of the spleen and bilateral adrenal glands is unremarkable.  In the distal body and tail of the pancreas there appears to be multifocal ductal ectasia. Postcontrast images are limited by substantial motion, but no definite enhancing pancreatic mass is confidently identified on today's study.  IMPRESSION: 1. Multiple bilateral renal lesions have imaging characteristics compatible with a combination of simple cysts and proteinaceous and/or hemorrhagic cysts, as discussed above. 2. 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 With these limitations in mind, this has a generally benign appearance, but could indicate underlying IPMN (intraductal papillary mucinous neoplasm). Accordingly, a repeat MRI of the abdomen with and without IV gadolinium in 6 months is recommended to evaluate the stability of this finding. This recommendation follows ACR consensus guidelines: Managing Incidental Findings on Abdominal CT: White Paper of the ACR Incidental Findings Committee. J Am Coll Radiol 2010;7:754-773. 3. Additional incidental findings, as above.   Electronically Signed   By: Vinnie Langton M.D.   On: 10/12/2013 10:46    ASSESSMENT:  #1. Peripheral blood lymphoproliferative disorder with no evidence  of lymphadenopathy on CT scan, ZAP- 70 negative  #2. Polycythemia  secondary to chronic obstructive pulmonary disease and obstructive sleep apnea.  #3. Right renal lesion, possible tumor, consistent with hemorrhagic cyst on MRI. #4. Chronic obstructive pulmonary disease, , severe on PFTs.  #5. Obstructive sleep apnea syndrome, for  additional sleep study to fine tune oxygen and CPAP pressure requirements.  #6. Benign prostatic hypertrophy, symptomatic.  #7. Allergic rhinitis, on treatment, symptomatic today.  #8. Immune thrombocytopenia  #9. History of TIA in the past with no evidence of neurologic deficit at this time    PLAN:  #1. The patient was reassured and encouraged to decrease smoking down to 3 or 4 cigarettes per day. He appears to be well motivated. #2. Followup in 6 months with CBC, chem profile, LDH, beta-2 microglobulin, and peripheral blood flow cytometry.   All questions were answered. The patient knows to call the clinic with any problems, questions or concerns. We can certainly see the patient much sooner if necessary.   I spent 25 minutes counseling the patient face to face. The total time spent in the appointment was 30 minutes.    Farrel Gobble, MD 11/02/2013 10:20 AM  DISCLAIMER:  This note was dictated with voice recognition software.  Similar sounding words can inadvertently be transcribed inaccurately and may not be corrected upon review.

## 2013-11-02 NOTE — Patient Instructions (Signed)
[facility] Discharge Instructions  RECOMMENDATIONS MADE BY THE CONSULTANT AND ANY TEST RESULTS WILL BE SENT TO YOUR REFERRING PHYSICIAN.  We will see you in 6 months for a follow up visit with the doctor and repeat lab work. Please call for any questions or concerns.  Thank you for choosing [facility] to provide your oncology and hematology care.  To afford each patient quality time with our providers, please arrive at least 15 minutes before your scheduled appointment time.  With your help, our goal is to use those 15 minutes to complete the necessary work-up to ensure our physicians have the information they need to help with your evaluation and healthcare recommendations.    Effective January 1st, 2014, we ask that you re-schedule your appointment with our physicians should you arrive 10 or more minutes late for your appointment.  We strive to give you quality time with our providers, and arriving late affects you and other patients whose appointments are after yours.    Again, thank you for choosing [facility].  Our hope is that these requests will decrease the amount of time that you wait before being seen by our physicians.       _____________________________________________________________  Should you have questions after your visit to [facility], please contact our office at [phone] between the hours of 8:30 a.m. and 5:00 p.m.  Voicemails left after 4:30 p.m. will not be returned until the following business day.  For prescription refill requests, have your pharmacy contact our office with your prescription refill request.

## 2013-11-09 ENCOUNTER — Ambulatory Visit: Payer: Medicare Other | Attending: Neurology | Admitting: Sleep Medicine

## 2013-11-09 VITALS — Ht 69.0 in | Wt 216.0 lb

## 2013-11-09 DIAGNOSIS — R0989 Other specified symptoms and signs involving the circulatory and respiratory systems: Secondary | ICD-10-CM | POA: Insufficient documentation

## 2013-11-09 DIAGNOSIS — R0609 Other forms of dyspnea: Secondary | ICD-10-CM | POA: Insufficient documentation

## 2013-11-09 DIAGNOSIS — G4761 Periodic limb movement disorder: Secondary | ICD-10-CM | POA: Insufficient documentation

## 2013-11-09 DIAGNOSIS — G4733 Obstructive sleep apnea (adult) (pediatric): Secondary | ICD-10-CM | POA: Insufficient documentation

## 2013-11-09 DIAGNOSIS — G4730 Sleep apnea, unspecified: Secondary | ICD-10-CM

## 2013-11-12 NOTE — Sleep Study (Signed)
  John A. Merlene Laughter, MD     [email or web address]        NOCTURNAL POLYSOMNOGRAM    LOCATION: SLEEP LAB FACILITY: [facility]   PHYSICIAN: Shiree Altemus A. Merlene Mcgrath, M.D.   DATE OF STUDY: 11/09/2013.   REFERRING PHYSICIAN: Farrel Gobble.  INDICATIONS: This is a 68 year old man who presents with a known history of obstructive status syndrome. This is a CPAP titration recording.  MEDICATIONS:  Prior to Admission medications   Medication Sig Start Date End Date Taking? Authorizing Provider  allopurinol (ZYLOPRIM) 100 MG tablet Take 100 mg by mouth daily.    Historical Provider, MD  amLODipine-benazepril (LOTREL) 10-40 MG per capsule Take 1 capsule by mouth daily.    Historical Provider, MD  aspirin 81 MG tablet Take 81 mg by mouth daily.    Historical Provider, MD  indomethacin (INDOCIN) 25 MG capsule Take 25 mg by mouth 3 (three) times daily as needed. For gout    Historical Provider, MD  loratadine (CLARITIN) 10 MG tablet Take 10 mg by mouth daily.    Historical Provider, MD  metoprolol succinate (TOPROL-XL) 100 MG 24 hr tablet Take 100 mg by mouth daily. Take with or immediately following a meal.    Historical Provider, MD  simvastatin (ZOCOR) 20 MG tablet Take 20 mg by mouth daily.    Historical Provider, MD  tamsulosin (FLOMAX) 0.4 MG CAPS capsule Take 1 capsule (0.4 mg total) by mouth daily after supper. 10/05/13   Farrel Gobble, MD  tiotropium (SPIRIVA) 18 MCG inhalation capsule Place 18 mcg into inhaler and inhale daily.    Historical Provider, MD      EPWORTH SLEEPINESS SCALE: 5.   BMI: 31.   ARCHITECTURAL SUMMARY: Total recording time was 9 minutes. Sleep efficiency 68 %. Sleep latency 23 minutes. REM latency 137 minutes. Stage NI 9 %, N2 59 % and N3 19 % and REM sleep 13 %.    RESPIRATORY DATA:  Baseline oxygen saturation is 83 %. The lowest saturation is 70 %. The patient was placed in positive pressure starting at 5 and increase to 10. Optimal pressure  is 10. However, the patient had prolonged episodes of desaturation without obstructive events even on optimal pressures. The total minutes spent below 88% is 418. This suggests hypoventilation syndrome.  LIMB MOVEMENT SUMMARY: PLM index 40.   ELECTROCARDIOGRAM SUMMARY: Average heart rate is 62 with no significant dysrhythmias observed.   IMPRESSION:  1. Obstructive sleep apnea syndrome which responded well to his CPAP of 10. 2. Marked hypoventilation syndrome. 2 L of supplemental oxygen is suggested. 3. Severe periodic limb movement disorder.  Thanks for this referral.  [name] A. Merlene Mcgrath, M.D. Diplomat, Tax adviser of Sleep Medicine.

## 2014-05-06 ENCOUNTER — Ambulatory Visit (HOSPITAL_COMMUNITY): Payer: Medicare Other

## 2014-05-06 ENCOUNTER — Other Ambulatory Visit (HOSPITAL_COMMUNITY): Payer: Medicare Other

## 2014-05-07 NOTE — Progress Notes (Signed)
This encounter was created in error - please disregard.

## 2014-05-13 ENCOUNTER — Encounter (HOSPITAL_COMMUNITY): Payer: Self-pay

## 2014-11-25 ENCOUNTER — Other Ambulatory Visit: Payer: Self-pay

## 2015-11-07 ENCOUNTER — Emergency Department (HOSPITAL_COMMUNITY): Payer: Medicare Other

## 2015-11-07 ENCOUNTER — Emergency Department (HOSPITAL_COMMUNITY)
Admission: EM | Admit: 2015-11-07 | Discharge: 2015-11-07 | Disposition: A | Discharge status: Home / Self Care | Payer: Medicare Other | Attending: Emergency Medicine | Admitting: Emergency Medicine

## 2015-11-07 ENCOUNTER — Encounter (HOSPITAL_COMMUNITY): Payer: Self-pay

## 2015-11-07 DIAGNOSIS — F17200 Nicotine dependence, unspecified, uncomplicated: Secondary | ICD-10-CM | POA: Insufficient documentation

## 2015-11-07 DIAGNOSIS — J449 Chronic obstructive pulmonary disease, unspecified: Secondary | ICD-10-CM | POA: Diagnosis not present

## 2015-11-07 DIAGNOSIS — Z8673 Personal history of transient ischemic attack (TIA), and cerebral infarction without residual deficits: Secondary | ICD-10-CM | POA: Diagnosis not present

## 2015-11-07 DIAGNOSIS — J939 Pneumothorax, unspecified: Secondary | ICD-10-CM | POA: Diagnosis not present

## 2015-11-07 DIAGNOSIS — I10 Essential (primary) hypertension: Secondary | ICD-10-CM | POA: Diagnosis not present

## 2015-11-07 DIAGNOSIS — J45909 Unspecified asthma, uncomplicated: Secondary | ICD-10-CM | POA: Diagnosis not present

## 2015-11-07 DIAGNOSIS — R0602 Shortness of breath: Secondary | ICD-10-CM

## 2015-11-07 HISTORY — DX: Unspecified asthma, uncomplicated: J45.909

## 2015-11-07 LAB — CBC WITH DIFFERENTIAL/PLATELET
BASOS PCT: 0 %
Basophils Absolute: 0 10*3/uL (ref 0.0–0.1)
Eosinophils Absolute: 0.4 10*3/uL (ref 0.0–0.7)
Eosinophils Relative: 3 %
HCT: 53 % — ABNORMAL HIGH (ref 39.0–52.0)
HEMOGLOBIN: 17.6 g/dL — AB (ref 13.0–17.0)
LYMPHS PCT: 28 %
Lymphs Abs: 3.6 10*3/uL (ref 0.7–4.0)
MCH: 31.9 pg (ref 26.0–34.0)
MCHC: 33.2 g/dL (ref 30.0–36.0)
MCV: 96 fL (ref 78.0–100.0)
MONO ABS: 0.9 10*3/uL (ref 0.1–1.0)
Monocytes Relative: 7 %
NEUTROS ABS: 7.8 10*3/uL — AB (ref 1.7–7.7)
Neutrophils Relative %: 62 %
PLATELETS: 150 10*3/uL (ref 150–400)
RBC: 5.52 MIL/uL (ref 4.22–5.81)
RDW: 14.4 % (ref 11.5–15.5)
WBC: 12.9 10*3/uL — ABNORMAL HIGH (ref 4.0–10.5)

## 2015-11-07 LAB — BASIC METABOLIC PANEL
Anion gap: 9 (ref 5–15)
BUN: 13 mg/dL (ref 6–20)
CO2: 30 mmol/L (ref 22–32)
Calcium: 9.1 mg/dL (ref 8.9–10.3)
Chloride: 97 mmol/L — ABNORMAL LOW (ref 101–111)
Creatinine, Ser: 1.16 mg/dL (ref 0.61–1.24)
GFR calc Af Amer: >60 mL/min (ref >60)
GLUCOSE: 90 mg/dL (ref 65–99)
POTASSIUM: 4.4 mmol/L (ref 3.5–5.1)
Sodium: 136 mmol/L (ref 135–145)

## 2015-11-07 MED ORDER — PREDNISONE 20 MG PO TABS
60.0000 mg | ORAL_TABLET | Freq: Once | ORAL | Status: AC
  Administered 2015-11-07: 60 mg via ORAL
  Filled 2015-11-07: qty 3

## 2015-11-07 MED ORDER — IOPAMIDOL (ISOVUE-300) INJECTION 61%
INTRAVENOUS | Status: AC
  Filled 2015-11-07: qty 100

## 2015-11-07 MED ORDER — IPRATROPIUM-ALBUTEROL 0.5-2.5 (3) MG/3ML IN SOLN
9.0000 mL | Freq: Once | RESPIRATORY_TRACT | Status: AC
  Administered 2015-11-07: 9 mL via RESPIRATORY_TRACT
  Filled 2015-11-07: qty 3

## 2015-11-07 MED ORDER — IOPAMIDOL (ISOVUE-300) INJECTION 61%
INTRAVENOUS | Status: AC
  Administered 2015-11-07: 100 mL
  Filled 2015-11-07: qty 100

## 2015-11-07 NOTE — ED Notes (Signed)
Radiology called RN for MD taking care of pt; Phone call given to Dr.Ress

## 2015-11-07 NOTE — ED Notes (Signed)
Pt. O2 Sats 88% on 3L, Rees MD made aware, pt. O2 increased to 4L.

## 2015-11-07 NOTE — ED Notes (Signed)
Pt denies any pain at this time.

## 2015-11-07 NOTE — ED Notes (Signed)
Pt from [facility]. Per CT results, pt with R 25% pneumothorax discovered on Wednesday 11/05/15. Pt to be seen by [facility], no MD established at this time. Increasing SOB today. Pt sats 80% on RA. Normally on 3L O2.

## 2015-11-07 NOTE — ED Provider Notes (Signed)
CSN: [id number]     Arrival date & time 11/07/15  1518 History   First MD Initiated Contact with Patient 11/07/15 1536     Chief Complaint  Patient presents with  . Shortness of Breath   HPI Pt from [facility]. Per CT results, pt with R 25% pneumothorax discovered on Wednesday 11/05/15. Pt to be seen by [facility], no MD established at this time. Increasing SOB today. Pt sats 80% on RA. Normally on 3L O2. Patient states symptoms are severe. Denies any chest pain. Nonrebreather placed in route shows helped his symptoms. He endorses a chronic cough which is not particularly worse today. Also endorse some mild diarrhea as he states he has "virus". Patient denies any leg swelling.   Past Medical History  Diagnosis Date  . Hypertension   . Gout   . COPD (chronic obstructive pulmonary disease) ([place])   . TIA (transient ischemic attack) 20 y ago  . Cataract     starting to form per pt per eye md  . Borderline diabetic   . High cholesterol   . Polycythemia   . Leukocytosis   . Asthma    Past Surgical History  Procedure Laterality Date  . Hernia repair  @ 70 years of age & then again @ 70 years of age   History reviewed. No pertinent family history. Social History  Substance Use Topics  . Smoking status: Current Every Day Smoker -- 1.00 packs/day for 50 years  . Smokeless tobacco: Never Used  . Alcohol Use: 12.6 oz/week    21 Cans of beer per week    Review of Systems  Constitutional: Negative for fever.  Allergic/Immunologic: Negative for immunocompromised state.  All other systems reviewed and are negative.     Allergies  Review of patient's allergies indicates no known allergies.  Home Medications   Prior to Admission medications   Medication Sig Start Date End Date Taking? Authorizing Provider  allopurinol (ZYLOPRIM) 100 MG tablet Take 200 mg by mouth daily.    Yes Historical Provider, MD  amLODipine-benazepril (LOTREL) 10-40 MG per capsule Take 1 capsule by mouth  daily.   Yes Historical Provider, MD  fexofenadine (ALLEGRA) 180 MG tablet Take 180 mg by mouth daily. 08/14/15  Yes Historical Provider, MD  furosemide (LASIX) 20 MG tablet Take 20 mg by mouth daily.  11/06/15  Yes Historical Provider, MD  indomethacin (INDOCIN) 25 MG capsule Take 25 mg by mouth 3 (three) times daily as needed. For gout   Yes Historical Provider, MD  ipratropium-albuterol (DUONEB) 0.5-2.5 (3) MG/3ML SOLN Inhale 3 mLs into the lungs daily.  10/02/15  Yes Historical Provider, MD  metoprolol succinate (TOPROL-XL) 100 MG 24 hr tablet Take 100 mg by mouth 2 (two) times daily. Take with or immediately following a meal.   Yes Historical Provider, MD  simvastatin (ZOCOR) 20 MG tablet Take 20 mg by mouth daily.   Yes Historical Provider, MD  tiotropium (SPIRIVA) 18 MCG inhalation capsule Place 18 mcg into inhaler and inhale daily.   Yes Historical Provider, MD  XARELTO 20 MG TABS tablet Take 20 mg by mouth every morning.  10/06/15  Yes Historical Provider, MD   BP 150/86 mmHg  Pulse 102  Temp(Src) 98.4 F (36.9 C) (Oral)  Resp 22  SpO2 84% Physical Exam  Constitutional: He appears well-developed and well-nourished. No distress.  HENT:  Head: Normocephalic and atraumatic.  Left Ear: External ear normal.  Eyes: Conjunctivae are normal. Pupils are equal, round, and  reactive to light. Right eye exhibits no discharge. Left eye exhibits no discharge.  Neck: Normal range of motion. Neck supple.  Cardiovascular: Intact distal pulses.   No murmur heard. Irregular rhythm, tachycardic initially  Pulmonary/Chest: He is in respiratory distress. He has wheezes. He has no rales.  On NRB Course breath sounds bilaterally  Abdominal: Soft. Bowel sounds are normal. He exhibits no distension and no mass. There is no tenderness. There is no rebound and no guarding.  Musculoskeletal: He exhibits no edema (of LEs).  Neurological: He is alert.  Skin: Skin is warm. He is not diaphoretic.  Psychiatric: He  has a normal mood and affect.    ED Course  Procedures (including critical care time) Labs Review Labs Reviewed  CBC WITH DIFFERENTIAL/PLATELET - Abnormal; Notable for the following:    WBC 12.9 (*)    Hemoglobin 17.6 (*)    HCT 53.0 (*)    Neutro Abs 7.8 (*)    All other components within normal limits  BASIC METABOLIC PANEL - Abnormal; Notable for the following:    Chloride 97 (*)    All other components within normal limits    Imaging Review Dg Chest 2 View  11/07/2015  ADDENDUM REPORT: 11/07/2015 17:33 ADDENDUM: The patient's resident in the emergency room called indicating the patient had a recently seen right-sided pneumothorax. There is no definitive pneumothorax seen on the frontal view. However, I suspect a loculated anterior component based on the lateral view. A CT scan is being performed. The clinical team is aware. Electronically Signed   By: Dorise Bullion III M.D   On: 11/07/2015 17:33  11/07/2015  CLINICAL DATA:  Shortness of breath for 3 days EXAM: CHEST  2 VIEW COMPARISON:  CT scan Sep 28, 2013 and chest x-ray November 11, 2010 FINDINGS: There is new opacity in the right middle and lower lobes with obscuration of the right heart border. There is a linear interface between opacity and aerated lung on the lateral view suggesting a possible lobar infiltrate. There may be associated effusion. The left lung is clear. No suspicious change in the cardiomediastinal silhouette. The study is otherwise stable. IMPRESSION: Opacity in the right middle and lower lobes with possible associated volume loss as well. The patient may benefit from a CT scan. Otherwise, recommend a short-term follow-up to ensure improvement. Electronically Signed: By: Dorise Bullion III M.D On: 11/07/2015 16:23   Ct Chest W Contrast  11/07/2015  CLINICAL DATA:  Subacute to chronic shortness of breath, intermittent cough which is productive, history of COPD and asthma, current history of chronic lymphocytic leukemia,  diagnosed by CT earlier this week in [place] with small right effusion and pneumothorax, images not available EXAM: CT CHEST WITH CONTRAST TECHNIQUE: Multidetector CT imaging of the chest was performed during intravenous contrast administration. CONTRAST:  75 mL Isovue-300 COMPARISON:  09/28/2013 FINDINGS: Mediastinum/Nodes: Numerous left thyroid nodules which appear hypodense and heterogeneous. The largest is 2.9 cm. The appearance is similar compared to prior study. There are lymph nodes in the pretracheal region measuring up to 10 mm. There are smaller AP window lymph nodes. There is a right hilar lymph node measuring 15 mm in short axis. Mild calcification of the aorta. Coronary artery calcification noted. No pericardial effusion. Lungs/Pleura: Moderate to large right pleural effusion. A pneumothorax is seen anteriorly measuring up to 2.4 cm. There are numerous collections of extrapleural fat identified along the anterior and medial aspect of the right thorax inferiorly. Diaphragm appears intact. There is  extensive compressive atelectasis in the right lower lobe there is mild centrilobular emphysematous change in the bilateral upper lobes. There is mild dependent atelectasis at the left base. Upper abdomen: 3 cm proteinaceous cyst right kidney with average attenuation value of 70. It appears unchanged in size when compared to 09/28/2013 and was shown by May 2015 MRI to be a cyst. Other smaller renal cysts are stable bilaterally. 5 mm low-attenuation lesion in the right lobe of the liver image number 130. This is not seen on the prior study. It is too small characterize. Musculoskeletal: No acute findings IMPRESSION: 1. Mild emphysematous change. 2.  Small right pneumothorax. 3. Moderate to large right pleural effusion. Unilateral effusion of uncertain etiology, malignancy not excluded. 4. 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 5.  Indeterminate 58mm  liver lesion 6.  Stable high density cyst right kidney 7. Thyroid nodules. Although relatively stable, nonemergent thyroid ultrasound suggested. Critical Value/emergent results were called by telephone at the time of interpretation on 11/07/2015 at 7:55 pm to Dr. Bryson Ha [facility] , who verbally acknowledged these results. Electronically Signed   By: Skipper Cliche M.D.   On: 11/07/2015 19:55   I have personally reviewed and evaluated these images and lab results as part of my medical decision-making.   EKG Interpretation   Date/Time:  Friday November 07 2015 15:23:19 EDT Ventricular Rate:  98 PR Interval:    QRS Duration: 86 QT Interval:  348 QTC Calculation: 444 R Axis:   57 Text Interpretation:  Atrial fibrillation Anteroseptal infarct , age  undetermined Abnormal ECG Confirmed by Hazle Coca [phone]) on 11/07/2015  3:54:49 PM Also confirmed by Hazle Coca [phone]), editor [place]  [facility],  [place] ([id number])  on 11/07/2015 4:01:28 PM      MDM   Final diagnoses:  Shortness of breath  SOB (shortness of breath)  Pneumothorax   Low suspicion for ACS. EKG with A. fib but no signs of acute MI. Patient is not having any chest pain. Chest x-ray with questionable loculated anterior pneumothorax. Doubt PE as patient is overall low risk, atypical story and pneumothorax is much more likely to be the etiology. Patient is wheezing and has history of COPD and states recently with a virus, no fever. Prednisone, 2 nebs ordered. Patient was weaned off the nonrebreather and into his 3 L nasal cannula but was hypoxic to 87% so was placed on 4 L, which is a new oxygen requirement. Patient however is noncompliant with his oxygen at home and states he feels fine and never wears oxygen at home so it is unclear what oxygen saturations patient lives that. Spoke with CT surgery, will obtain a repeat CT of chest to assess pneumothorax. CT C with pleural effusion, small right pneumothorax. After discussing further with patient, he  states he's had this pneumothorax for a year. Also has been evaluated in the past for his effusion. He is back to his home O2 after prednisone and duonebs. Likely COPD exacerbation. Patient states that he often feels short of breath when he gets a cold. Discussed prednisone for COPD exacerbation but he states that he gets psychotic with this patient encouraged to take his home COPD medications and has oxygen to wear. He is satting 92% on 2 L which is below his home oxygen requirement now. Patient to follow up with his pulmonologist. Patient discharged in good condition. Return for any worsening shortness of breath, chest pain or any other concerning symptoms.  Karma Greaser, MD 11/08/15 0004  Quintella Reichert, MD 11/10/15 [phone]

## 2017-03-13 ENCOUNTER — Inpatient Hospital Stay (HOSPITAL_COMMUNITY)
Admission: EM | Admit: 2017-03-13 | Discharge: 2017-03-15 | DRG: 193 | Disposition: A | Discharge status: Home / Self Care | Payer: Medicare Other | Attending: Internal Medicine | Admitting: Internal Medicine

## 2017-03-13 ENCOUNTER — Emergency Department (HOSPITAL_COMMUNITY): Payer: Medicare Other

## 2017-03-13 ENCOUNTER — Encounter (HOSPITAL_COMMUNITY): Payer: Self-pay | Admitting: Emergency Medicine

## 2017-03-13 DIAGNOSIS — I482 Chronic atrial fibrillation, unspecified: Secondary | ICD-10-CM | POA: Diagnosis present

## 2017-03-13 DIAGNOSIS — Z9119 Patient's noncompliance with other medical treatment and regimen: Secondary | ICD-10-CM

## 2017-03-13 DIAGNOSIS — E041 Nontoxic single thyroid nodule: Secondary | ICD-10-CM | POA: Diagnosis present

## 2017-03-13 DIAGNOSIS — F17210 Nicotine dependence, cigarettes, uncomplicated: Secondary | ICD-10-CM | POA: Diagnosis present

## 2017-03-13 DIAGNOSIS — E785 Hyperlipidemia, unspecified: Secondary | ICD-10-CM | POA: Diagnosis present

## 2017-03-13 DIAGNOSIS — J9811 Atelectasis: Secondary | ICD-10-CM | POA: Diagnosis present

## 2017-03-13 DIAGNOSIS — J189 Pneumonia, unspecified organism: Principal | ICD-10-CM | POA: Diagnosis present

## 2017-03-13 DIAGNOSIS — R040 Epistaxis: Secondary | ICD-10-CM | POA: Diagnosis present

## 2017-03-13 DIAGNOSIS — I4891 Unspecified atrial fibrillation: Secondary | ICD-10-CM | POA: Diagnosis present

## 2017-03-13 DIAGNOSIS — R042 Hemoptysis: Secondary | ICD-10-CM | POA: Diagnosis present

## 2017-03-13 DIAGNOSIS — J90 Pleural effusion, not elsewhere classified: Secondary | ICD-10-CM | POA: Diagnosis not present

## 2017-03-13 DIAGNOSIS — J449 Chronic obstructive pulmonary disease, unspecified: Secondary | ICD-10-CM | POA: Diagnosis present

## 2017-03-13 DIAGNOSIS — G4733 Obstructive sleep apnea (adult) (pediatric): Secondary | ICD-10-CM | POA: Diagnosis present

## 2017-03-13 DIAGNOSIS — J440 Chronic obstructive pulmonary disease with acute lower respiratory infection: Secondary | ICD-10-CM | POA: Diagnosis present

## 2017-03-13 DIAGNOSIS — M109 Gout, unspecified: Secondary | ICD-10-CM | POA: Diagnosis present

## 2017-03-13 DIAGNOSIS — D751 Secondary polycythemia: Secondary | ICD-10-CM | POA: Diagnosis present

## 2017-03-13 DIAGNOSIS — Z9981 Dependence on supplemental oxygen: Secondary | ICD-10-CM

## 2017-03-13 DIAGNOSIS — J181 Lobar pneumonia, unspecified organism: Secondary | ICD-10-CM

## 2017-03-13 DIAGNOSIS — I509 Heart failure, unspecified: Secondary | ICD-10-CM

## 2017-03-13 DIAGNOSIS — J9621 Acute and chronic respiratory failure with hypoxia: Secondary | ICD-10-CM | POA: Diagnosis present

## 2017-03-13 DIAGNOSIS — J9622 Acute and chronic respiratory failure with hypercapnia: Secondary | ICD-10-CM | POA: Diagnosis present

## 2017-03-13 DIAGNOSIS — C9110 Chronic lymphocytic leukemia of B-cell type not having achieved remission: Secondary | ICD-10-CM | POA: Diagnosis present

## 2017-03-13 DIAGNOSIS — D693 Immune thrombocytopenic purpura: Secondary | ICD-10-CM | POA: Diagnosis present

## 2017-03-13 DIAGNOSIS — E0590 Thyrotoxicosis, unspecified without thyrotoxic crisis or storm: Secondary | ICD-10-CM | POA: Diagnosis present

## 2017-03-13 DIAGNOSIS — Z7901 Long term (current) use of anticoagulants: Secondary | ICD-10-CM

## 2017-03-13 DIAGNOSIS — N183 Chronic kidney disease, stage 3 unspecified: Secondary | ICD-10-CM

## 2017-03-13 DIAGNOSIS — I5031 Acute diastolic (congestive) heart failure: Secondary | ICD-10-CM | POA: Diagnosis present

## 2017-03-13 DIAGNOSIS — R933 Abnormal findings on diagnostic imaging of other parts of digestive tract: Secondary | ICD-10-CM | POA: Diagnosis present

## 2017-03-13 DIAGNOSIS — Z79899 Other long term (current) drug therapy: Secondary | ICD-10-CM

## 2017-03-13 DIAGNOSIS — I130 Hypertensive heart and chronic kidney disease with heart failure and stage 1 through stage 4 chronic kidney disease, or unspecified chronic kidney disease: Secondary | ICD-10-CM | POA: Diagnosis present

## 2017-03-13 DIAGNOSIS — J441 Chronic obstructive pulmonary disease with (acute) exacerbation: Secondary | ICD-10-CM | POA: Diagnosis present

## 2017-03-13 DIAGNOSIS — R7303 Prediabetes: Secondary | ICD-10-CM | POA: Diagnosis present

## 2017-03-13 DIAGNOSIS — Z8673 Personal history of transient ischemic attack (TIA), and cerebral infarction without residual deficits: Secondary | ICD-10-CM

## 2017-03-13 NOTE — ED Triage Notes (Signed)
Pt states he has been coughing up blood. Started on Wednesday with coughing up blood x1 then resumed today. Pt states he has been out of power since Thursaday and hasn't had his O2 which he is to wear continuouly at 2l/min. Pt's O2 sats on room air were 70%. Placed pt on O2 at 5l/min and sats only up to 84%.

## 2017-03-14 ENCOUNTER — Encounter (HOSPITAL_COMMUNITY): Payer: Self-pay

## 2017-03-14 ENCOUNTER — Emergency Department (HOSPITAL_COMMUNITY): Payer: Medicare Other

## 2017-03-14 ENCOUNTER — Other Ambulatory Visit (HOSPITAL_COMMUNITY): Payer: Medicare Other

## 2017-03-14 DIAGNOSIS — M109 Gout, unspecified: Secondary | ICD-10-CM | POA: Diagnosis present

## 2017-03-14 DIAGNOSIS — I482 Chronic atrial fibrillation, unspecified: Secondary | ICD-10-CM | POA: Diagnosis present

## 2017-03-14 DIAGNOSIS — R042 Hemoptysis: Secondary | ICD-10-CM

## 2017-03-14 DIAGNOSIS — E785 Hyperlipidemia, unspecified: Secondary | ICD-10-CM | POA: Diagnosis present

## 2017-03-14 DIAGNOSIS — J9811 Atelectasis: Secondary | ICD-10-CM | POA: Diagnosis present

## 2017-03-14 DIAGNOSIS — J9622 Acute and chronic respiratory failure with hypercapnia: Secondary | ICD-10-CM

## 2017-03-14 DIAGNOSIS — I4891 Unspecified atrial fibrillation: Secondary | ICD-10-CM | POA: Diagnosis present

## 2017-03-14 DIAGNOSIS — Z9981 Dependence on supplemental oxygen: Secondary | ICD-10-CM | POA: Diagnosis not present

## 2017-03-14 DIAGNOSIS — R040 Epistaxis: Secondary | ICD-10-CM | POA: Diagnosis present

## 2017-03-14 DIAGNOSIS — G4733 Obstructive sleep apnea (adult) (pediatric): Secondary | ICD-10-CM | POA: Diagnosis present

## 2017-03-14 DIAGNOSIS — J189 Pneumonia, unspecified organism: Secondary | ICD-10-CM | POA: Diagnosis present

## 2017-03-14 DIAGNOSIS — I509 Heart failure, unspecified: Secondary | ICD-10-CM

## 2017-03-14 DIAGNOSIS — J181 Lobar pneumonia, unspecified organism: Secondary | ICD-10-CM

## 2017-03-14 DIAGNOSIS — J440 Chronic obstructive pulmonary disease with acute lower respiratory infection: Secondary | ICD-10-CM | POA: Diagnosis present

## 2017-03-14 DIAGNOSIS — E041 Nontoxic single thyroid nodule: Secondary | ICD-10-CM | POA: Diagnosis present

## 2017-03-14 DIAGNOSIS — E0590 Thyrotoxicosis, unspecified without thyrotoxic crisis or storm: Secondary | ICD-10-CM | POA: Diagnosis present

## 2017-03-14 DIAGNOSIS — N183 Chronic kidney disease, stage 3 unspecified: Secondary | ICD-10-CM

## 2017-03-14 DIAGNOSIS — F17210 Nicotine dependence, cigarettes, uncomplicated: Secondary | ICD-10-CM | POA: Diagnosis present

## 2017-03-14 DIAGNOSIS — R933 Abnormal findings on diagnostic imaging of other parts of digestive tract: Secondary | ICD-10-CM | POA: Diagnosis present

## 2017-03-14 DIAGNOSIS — J9621 Acute and chronic respiratory failure with hypoxia: Secondary | ICD-10-CM

## 2017-03-14 DIAGNOSIS — J441 Chronic obstructive pulmonary disease with (acute) exacerbation: Secondary | ICD-10-CM | POA: Diagnosis present

## 2017-03-14 DIAGNOSIS — C9110 Chronic lymphocytic leukemia of B-cell type not having achieved remission: Secondary | ICD-10-CM | POA: Diagnosis present

## 2017-03-14 DIAGNOSIS — I5031 Acute diastolic (congestive) heart failure: Secondary | ICD-10-CM | POA: Diagnosis present

## 2017-03-14 DIAGNOSIS — D693 Immune thrombocytopenic purpura: Secondary | ICD-10-CM | POA: Diagnosis present

## 2017-03-14 DIAGNOSIS — J90 Pleural effusion, not elsewhere classified: Secondary | ICD-10-CM

## 2017-03-14 DIAGNOSIS — R7303 Prediabetes: Secondary | ICD-10-CM | POA: Diagnosis present

## 2017-03-14 DIAGNOSIS — D751 Secondary polycythemia: Secondary | ICD-10-CM | POA: Diagnosis present

## 2017-03-14 DIAGNOSIS — I130 Hypertensive heart and chronic kidney disease with heart failure and stage 1 through stage 4 chronic kidney disease, or unspecified chronic kidney disease: Secondary | ICD-10-CM | POA: Diagnosis present

## 2017-03-14 LAB — CBC WITH DIFFERENTIAL/PLATELET
Basophils Absolute: 0 K/uL (ref 0.0–0.1)
Basophils Relative: 0 %
Eosinophils Absolute: 0.2 K/uL (ref 0.0–0.7)
Eosinophils Relative: 2 %
HCT: 49.2 % (ref 39.0–52.0)
Hemoglobin: 16.8 g/dL (ref 13.0–17.0)
Lymphocytes Relative: 33 %
Lymphs Abs: 2.7 K/uL (ref 0.7–4.0)
MCH: 32.9 pg (ref 26.0–34.0)
MCHC: 34.1 g/dL (ref 30.0–36.0)
MCV: 96.5 fL (ref 78.0–100.0)
Monocytes Absolute: 0.8 K/uL (ref 0.1–1.0)
Monocytes Relative: 9 %
Neutro Abs: 4.7 K/uL (ref 1.7–7.7)
Neutrophils Relative %: 56 %
Platelets: 126 K/uL — ABNORMAL LOW (ref 150–400)
RBC: 5.1 MIL/uL (ref 4.22–5.81)
RDW: 14.6 % (ref 11.5–15.5)
WBC: 8.4 K/uL (ref 4.0–10.5)

## 2017-03-14 LAB — HEPATIC FUNCTION PANEL
ALT: 16 U/L — AB (ref 17–63)
AST: 18 U/L (ref 15–41)
Albumin: 3.8 g/dL (ref 3.5–5.0)
Alkaline Phosphatase: 74 U/L (ref 38–126)
BILIRUBIN INDIRECT: 0.7 mg/dL (ref 0.3–0.9)
Bilirubin, Direct: 0.2 mg/dL (ref 0.1–0.5)
TOTAL PROTEIN: 6.6 g/dL (ref 6.5–8.1)
Total Bilirubin: 0.9 mg/dL (ref 0.3–1.2)

## 2017-03-14 LAB — BLOOD GAS, ARTERIAL
Acid-Base Excess: 2.9 mmol/L — ABNORMAL HIGH (ref 0.0–2.0)
Bicarbonate: 25.1 mmol/L (ref 20.0–28.0)
Drawn by: 22223
O2 Content: 6 L/min
O2 Saturation: 81.4 %
PCO2 ART: 55.8 mmHg — AB (ref 32.0–48.0)
PH ART: 7.326 — AB (ref 7.350–7.450)
pO2, Arterial: 49 mmHg — ABNORMAL LOW (ref 83.0–108.0)

## 2017-03-14 LAB — TSH: TSH: <0.01 u[IU]/mL — ABNORMAL LOW (ref 0.350–4.500)

## 2017-03-14 LAB — BASIC METABOLIC PANEL WITH GFR
Anion gap: 11 (ref 5–15)
BUN: 19 mg/dL (ref 6–20)
CO2: 29 mmol/L (ref 22–32)
Calcium: 9.2 mg/dL (ref 8.9–10.3)
Chloride: 97 mmol/L — ABNORMAL LOW (ref 101–111)
Creatinine, Ser: 1.27 mg/dL — ABNORMAL HIGH (ref 0.61–1.24)
GFR calc Af Amer: >60 mL/min
GFR calc non Af Amer: 56 mL/min — ABNORMAL LOW
Glucose, Bld: 96 mg/dL (ref 65–99)
Potassium: 3.8 mmol/L (ref 3.5–5.1)
Sodium: 137 mmol/L (ref 135–145)

## 2017-03-14 LAB — TROPONIN I: Troponin I: <0.03 ng/mL

## 2017-03-14 LAB — PROCALCITONIN: Procalcitonin: <0.1 ng/mL

## 2017-03-14 LAB — LIPASE, BLOOD: LIPASE: 31 U/L (ref 11–51)

## 2017-03-14 LAB — VITAMIN B12: VITAMIN B 12: 396 pg/mL (ref 180–914)

## 2017-03-14 LAB — PROTIME-INR
INR: 1.49
Prothrombin Time: 17.9 s — ABNORMAL HIGH (ref 11.4–15.2)

## 2017-03-14 LAB — BRAIN NATRIURETIC PEPTIDE: B Natriuretic Peptide: 349 pg/mL — ABNORMAL HIGH (ref 0.0–100.0)

## 2017-03-14 LAB — I-STAT CG4 LACTIC ACID, ED: Lactic Acid, Venous: 1.79 mmol/L (ref 0.5–1.9)

## 2017-03-14 MED ORDER — AMLODIPINE BESYLATE 5 MG PO TABS
10.0000 mg | ORAL_TABLET | Freq: Every day | ORAL | Status: DC
  Administered 2017-03-14: 10 mg via ORAL
  Filled 2017-03-14: qty 2

## 2017-03-14 MED ORDER — IPRATROPIUM-ALBUTEROL 0.5-2.5 (3) MG/3ML IN SOLN
3.0000 mL | Freq: Once | RESPIRATORY_TRACT | Status: AC
  Administered 2017-03-14: 3 mL via RESPIRATORY_TRACT
  Filled 2017-03-14: qty 3

## 2017-03-14 MED ORDER — LORATADINE 10 MG PO TABS
10.0000 mg | ORAL_TABLET | Freq: Every day | ORAL | Status: DC
  Administered 2017-03-14 – 2017-03-15 (×2): 10 mg via ORAL
  Filled 2017-03-14 (×2): qty 1

## 2017-03-14 MED ORDER — RIVAROXABAN 20 MG PO TABS
20.0000 mg | ORAL_TABLET | ORAL | Status: DC
  Administered 2017-03-14 – 2017-03-15 (×2): 20 mg via ORAL
  Filled 2017-03-14 (×3): qty 1

## 2017-03-14 MED ORDER — FUROSEMIDE 10 MG/ML IJ SOLN
40.0000 mg | Freq: Every day | INTRAMUSCULAR | Status: DC
  Administered 2017-03-14 – 2017-03-15 (×2): 40 mg via INTRAVENOUS
  Filled 2017-03-14 (×2): qty 4

## 2017-03-14 MED ORDER — ALLOPURINOL 100 MG PO TABS
200.0000 mg | ORAL_TABLET | Freq: Every day | ORAL | Status: DC
  Administered 2017-03-14 – 2017-03-15 (×2): 200 mg via ORAL
  Filled 2017-03-14 (×2): qty 2

## 2017-03-14 MED ORDER — AMLODIPINE BESY-BENAZEPRIL HCL 10-40 MG PO CAPS: 1.0000 | ORAL_CAPSULE | Freq: Every day | ORAL | Status: DC

## 2017-03-14 MED ORDER — METHYLPREDNISOLONE SODIUM SUCC 125 MG IJ SOLR
125.0000 mg | Freq: Once | INTRAMUSCULAR | Status: AC
  Administered 2017-03-14: 125 mg via INTRAVENOUS
  Filled 2017-03-14: qty 2

## 2017-03-14 MED ORDER — METHYLPREDNISOLONE SODIUM SUCC 40 MG IJ SOLR
40.0000 mg | Freq: Three times a day (TID) | INTRAMUSCULAR | Status: DC
  Administered 2017-03-14 – 2017-03-15 (×4): 40 mg via INTRAVENOUS
  Filled 2017-03-14 (×4): qty 1

## 2017-03-14 MED ORDER — BUDESONIDE 0.25 MG/2ML IN SUSP
0.2500 mg | Freq: Two times a day (BID) | RESPIRATORY_TRACT | Status: DC
  Administered 2017-03-14 – 2017-03-15 (×3): 0.25 mg via RESPIRATORY_TRACT
  Filled 2017-03-14 (×3): qty 2

## 2017-03-14 MED ORDER — ALBUTEROL SULFATE (2.5 MG/3ML) 0.083% IN NEBU: 2.5000 mg | INHALATION_SOLUTION | RESPIRATORY_TRACT | Status: DC | PRN

## 2017-03-14 MED ORDER — GLUCERNA SHAKE PO LIQD
237.0000 mL | Freq: Every day | ORAL | Status: DC
  Administered 2017-03-14: 237 mL via ORAL

## 2017-03-14 MED ORDER — PRAVASTATIN SODIUM 10 MG PO TABS
20.0000 mg | ORAL_TABLET | Freq: Every day | ORAL | Status: DC
  Administered 2017-03-14: 20 mg via ORAL
  Filled 2017-03-14: qty 2

## 2017-03-14 MED ORDER — IPRATROPIUM-ALBUTEROL 0.5-2.5 (3) MG/3ML IN SOLN
3.0000 mL | Freq: Four times a day (QID) | RESPIRATORY_TRACT | Status: DC
  Administered 2017-03-14 – 2017-03-15 (×4): 3 mL via RESPIRATORY_TRACT
  Filled 2017-03-14 (×4): qty 3

## 2017-03-14 MED ORDER — METOPROLOL SUCCINATE ER 50 MG PO TB24
100.0000 mg | ORAL_TABLET | Freq: Two times a day (BID) | ORAL | Status: DC
  Administered 2017-03-14 – 2017-03-15 (×3): 100 mg via ORAL
  Filled 2017-03-14: qty 2
  Filled 2017-03-14: qty 1
  Filled 2017-03-14 (×2): qty 2

## 2017-03-14 MED ORDER — BENAZEPRIL HCL 10 MG PO TABS
40.0000 mg | ORAL_TABLET | Freq: Every day | ORAL | Status: DC
  Filled 2017-03-14: qty 4

## 2017-03-14 MED ORDER — AZITHROMYCIN 500 MG IV SOLR
500.0000 mg | Freq: Once | INTRAVENOUS | Status: AC
  Administered 2017-03-14: 500 mg via INTRAVENOUS
  Filled 2017-03-14: qty 500

## 2017-03-14 MED ORDER — DEXTROSE 5 % IV SOLN
1.0000 g | Freq: Once | INTRAVENOUS | Status: AC
  Administered 2017-03-14: 1 g via INTRAVENOUS
  Filled 2017-03-14: qty 10

## 2017-03-14 MED ORDER — DEXTROSE 5 % IV SOLN
500.0000 mg | INTRAVENOUS | Status: DC
  Administered 2017-03-15: 500 mg via INTRAVENOUS
  Filled 2017-03-14 (×2): qty 500

## 2017-03-14 MED ORDER — IOPAMIDOL (ISOVUE-370) INJECTION 76%
100.0000 mL | Freq: Once | INTRAVENOUS | Status: AC | PRN
  Administered 2017-03-14: 100 mL via INTRAVENOUS

## 2017-03-14 MED ORDER — SIMVASTATIN 20 MG PO TABS
20.0000 mg | ORAL_TABLET | Freq: Every day | ORAL | Status: DC
  Filled 2017-03-14: qty 1

## 2017-03-14 MED ORDER — SODIUM CHLORIDE 0.9 % IV BOLUS (SEPSIS)
1000.0000 mL | Freq: Once | INTRAVENOUS | Status: AC
  Administered 2017-03-14: 1000 mL via INTRAVENOUS

## 2017-03-14 MED ORDER — DEXTROSE 5 % IV SOLN
1.0000 g | INTRAVENOUS | Status: DC
  Administered 2017-03-15: 1 g via INTRAVENOUS
  Filled 2017-03-14 (×2): qty 10

## 2017-03-14 NOTE — H&P (Addendum)
History and Physical    John Mcgrath [id number] DOB: July 14, 1945 DOA: 03/13/2017  PCP: Renee Rival, NP   Patient coming from: Home  Chief Complaint: Epistaxis, productive cough, dyspnea, wheezes   HPI: John Mcgrath is a 71 y.o. male with medical history significant for COPD with chronic hypoxic respiratory failure, chronic kidney disease stage II, chronic thrombocytopenia, and atrial fibrillation on Xarelto, now presenting to the emergency department with productive cough, dyspnea, wheezing, and epistaxis.  Patient reports that he bit his usual state of health until several days ago when he noted the insidious development of worsening dyspnea and productive cough. He reports recurrent intermittent epistaxis, now with some blood-tinged sputum that he attributes to postnasal drip. His condition has been exacerbated by a current power outage and inability to use his home nebulizer. He has also just emptied his last oxygen tank at home. He denies fevers or chills, denies lower extremity edema or tenderness, and denies chest pain or palpitations. No melena or hematochezia.  ED Course: Upon arrival to the ED, patient is found to be afebrile, saturating 70% on room air initially, tachypneic, and vitals otherwise stable. EKG features atrial fibrillation. Chest x-ray is notable for small bilateral effusions and basilar atelectasis.CTA chest is negative for PE, but notable for a small right pleural effusion and associated pneumonia. Also noted on the CTA is unchanged thyroid nodule, liver appearance suggestive of cirrhosis, and haziness about the pancreas, possibly reflecting an early pancreatitis. Chemistry panels notable for a creatinine of 1.27, consistent with his apparent baseline. CBC features a chronic stable thrombus cytopenia with platelets 126,000. Lactic acid is reassuring at 1.79. Blood cultures were collected in the ED, 1 L of normal saline was given, and the patient was treated with 125  mg of IV Solu-Medrol, Rocephin, and azithromycin. Oxygenation and work of breathing improved with supplemental oxygen and the ED treatments. Patient remains hemodynamically stable. He will be admitted to the medical/surgical unit for ongoing evaluation and management of community-acquired pneumonia with exacerbation in COPD.  Review of Systems:  All other systems reviewed and apart from HPI, are negative.  Past Medical History:  Diagnosis Date  . Asthma   . Borderline diabetic   . Cataract    starting to form per pt per eye md  . COPD (chronic obstructive pulmonary disease) ([place])   . Gout    pt denies  . High cholesterol   . Hypertension   . Leukocytosis   . Polycythemia   . TIA (transient ischemic attack) 20 y ago    Past Surgical History:  Procedure Laterality Date  . HERNIA REPAIR  @ 71 years of age & then again @ 71 years of age     reports that he has been smoking.  He has a 50.00 pack-year smoking history. He has never used smokeless tobacco. He reports that he drinks about 12.6 oz of alcohol per week . He reports that he does not use drugs.  No Known Allergies  Family History  Problem Relation Age of Onset  . Obesity Daughter      Prior to Admission medications   Medication Sig Start Date End Date Taking? Authorizing Provider  allopurinol (ZYLOPRIM) 100 MG tablet Take 200 mg by mouth daily.     [provider]  amLODipine-benazepril (LOTREL) 10-40 MG per capsule Take 1 capsule by mouth daily.    [provider]  fexofenadine (ALLEGRA) 180 MG tablet Take 180 mg by mouth daily. 08/14/15   [provider]  furosemide (LASIX) 20 MG tablet Take 20 mg by mouth daily.  11/06/15   [provider]  indomethacin (INDOCIN) 25 MG capsule Take 25 mg by mouth 3 (three) times daily as needed. For gout    [provider]  ipratropium-albuterol (DUONEB) 0.5-2.5 (3) MG/3ML SOLN Inhale 3 mLs into the lungs daily.  10/02/15   [provider]  metoprolol succinate (TOPROL-XL) 100 MG 24 hr tablet Take 100 mg by mouth 2 (two) times daily. Take with or immediately following a meal.    [provider]  simvastatin (ZOCOR) 20 MG tablet Take 20 mg by mouth daily.    [provider]  tiotropium (SPIRIVA) 18 MCG inhalation capsule Place 18 mcg into inhaler and inhale daily.    [provider]  XARELTO 20 MG TABS tablet Take 20 mg by mouth every morning.  10/06/15   [provider]    Physical Exam: Vitals:   03/14/17 0115 03/14/17 0118 03/14/17 0400 03/14/17 0501  BP:   122/88 118/83  Pulse: 95  77 (!) 102  Resp: (!) 23  (!) 22 (!) 22  Temp:      TempSrc:      SpO2: 94% 92% 91% 95%  Weight:      Height:          Constitutional: Not in acute distress, calm, in apparent discomfort Eyes: PERTLA, lids and conjunctivae normal ENMT: Mucous membranes are moist. Posterior pharynx clear of any exudate or lesions.   Neck: normal, supple, no masses, no thyromegaly Respiratory: Diminished bilaterally, prolonged expiratory phase, diffuse wheezes. Mild tachypnea, dyspnea with speech. No pallor.   Cardiovascular: Rate ~80 and irregular. No significant JVD. Abdomen: No distension, no tenderness, no masses palpated. Bowel sounds normal.  Musculoskeletal: no clubbing / cyanosis. No joint deformity upper and lower extremities.  Skin: no significant rashes, lesions, ulcers. Warm, dry, well-perfused. Neurologic: CN 2-12 grossly intact. Sensation intact. Strength 5/5 in all 4 limbs.  Psychiatric: Alert and oriented x 3. Calm, cooperative.     Labs on Admission: I have personally reviewed following labs and imaging studies  CBC:  Recent Labs Lab 03/14/17 0015  WBC 8.4  NEUTROABS 4.7  HGB 16.8  HCT 49.2  MCV 96.5  PLT 517*   Basic Metabolic Panel:  Recent Labs Lab 03/14/17 0015  NA 137  K 3.8  CL 97*  CO2 29  GLUCOSE 96  BUN 19  CREATININE 1.27*  CALCIUM 9.2   GFR: Estimated Creatinine  Clearance: 62.1 mL/min (A) (by C-G formula based on SCr of 1.27 mg/dL (H)). Liver Function Tests:  Recent Labs Lab 03/14/17 0329  AST 18  ALT 16*  ALKPHOS 74  BILITOT 0.9  PROT 6.6  ALBUMIN 3.8    Recent Labs Lab 03/14/17 0329  LIPASE 31   No results for input(s): AMMONIA in the last 168 hours. Coagulation Profile:  Recent Labs Lab 03/14/17 0015  INR 1.49   Cardiac Enzymes:  Recent Labs Lab 03/14/17 0015  TROPONINI <0.03   BNP (last 3 results) No results for input(s): PROBNP in the last 8760 hours. HbA1C: No results for input(s): HGBA1C in the last 72 hours. CBG: No results for input(s): GLUCAP in the last 168 hours. Lipid Profile: No results for input(s): CHOL, HDL, LDLCALC, TRIG, CHOLHDL, LDLDIRECT in the last 72 hours. Thyroid Function Tests: No results for input(s): TSH, T4TOTAL, FREET4, T3FREE, THYROIDAB in the last 72 hours. Anemia Panel: No results for input(s): VITAMINB12, FOLATE,  FERRITIN, TIBC, IRON, RETICCTPCT in the last 72 hours. Urine analysis: No results found for: COLORURINE, APPEARANCEUR, LABSPEC, PHURINE, GLUCOSEU, HGBUR, BILIRUBINUR, KETONESUR, PROTEINUR, UROBILINOGEN, NITRITE, LEUKOCYTESUR Sepsis Labs: @LABRCNTIP (procalcitonin:4,lacticidven:4) ) Recent Results (from the past 240 hour(s))  Blood culture (routine x 2)     Status: None (Preliminary result)   Collection Time: 03/14/17  3:29 AM  Result Value Ref Range Status   Specimen Description BLOOD RIGHT ARM  Final   Special Requests   Final    BOTTLES DRAWN AEROBIC AND ANAEROBIC Blood Culture adequate volume   Culture PENDING  Incomplete   Report Status PENDING  Incomplete  Blood culture (routine x 2)     Status: None (Preliminary result)   Collection Time: 03/14/17  3:52 AM  Result Value Ref Range Status   Specimen Description BLOOD LEFT ARM  Final   Special Requests   Final    BOTTLES DRAWN AEROBIC AND ANAEROBIC Blood Culture adequate volume   Culture PENDING  Incomplete    Report Status PENDING  Incomplete     Radiological Exams on Admission: Dg Chest 2 View  Result Date: 03/14/2017 CLINICAL DATA:  Coughing up blood since Wednesday. No power since Thursday. Unable to use home oxygen. Decreased oxygen saturation. EXAM: CHEST  2 VIEW COMPARISON:  11/07/2015 FINDINGS: Mild cardiac enlargement. No vascular congestion or edema. Small bilateral pleural effusions with basilar atelectasis. No pneumothorax. No focal consolidation. Calcification of the aorta. Degenerative changes in the spine. IMPRESSION: Small bilateral pleural effusions with basilar atelectasis. Cardiac enlargement. No focal consolidation or edema. Electronically Signed   By: Lucienne Capers M.D.   On: 03/14/2017 00:31   Ct Angio Chest Pe W And/or Wo Contrast  Result Date: 03/14/2017 CLINICAL DATA:  Coughing up blood since Wednesday. Unable use home oxygen due to loss of power. Decreased oxygen saturations. EXAM: CT ANGIOGRAPHY CHEST WITH CONTRAST TECHNIQUE: Multidetector CT imaging of the chest was performed using the standard protocol during bolus administration of intravenous contrast. Multiplanar CT image reconstructions and MIPs were obtained to evaluate the vascular anatomy. CONTRAST:  100 mL Isovue 370 COMPARISON:  11/07/2015 FINDINGS: Cardiovascular: Good opacification of the central and segmental pulmonary arteries. No focal filling defects. No evidence of significant pulmonary embolus. Mild cardiac enlargement. No pericardial effusions. Coronary artery calcifications. Calcification of the aorta. No evidence of aortic aneurysm or dissection. Mediastinum/Nodes: Enlarged left thyroid gland with dominant left thyroid nodule measuring 2.8 cm maximal diameter. No change since previous study. Esophagus is decompressed. No significant lymphadenopathy in the chest. Scattered lymph nodes are not pathologically enlarged. Lungs/Pleura: Small left pleural effusion with basilar consolidation, likely representing  pneumonia. Diffuse emphysematous changes throughout the lungs. No pneumothorax. Airways are patent. Upper Abdomen: Enlarged lateral segment of the left lobe of the liver may indicate cirrhotic changes. Tiny subcentimeter lesions are too small to characterize but probably represent cysts. Hemorrhagic cysts noted on the right kidney without change since prior study. There is mild hazy infiltration around the body and tail of the pancreas which could indicate early changes of acute pancreatitis. Musculoskeletal: Mild degenerative changes in the spine. No destructive bone lesions. Review of the MIP images confirms the above findings. IMPRESSION: 1. No evidence of significant pulmonary embolus. 2. Right pleural effusion with basilar consolidation suggesting pneumonia. 3. 2.8 cm diameter left thyroid nodule without change since prior study. 4. Hepatic configuration suggests cirrhosis. 5. Mild hazy infiltration around the body and tail the pancreas could indicate early changes of acute pancreatitis. No fluid collection or complication. 6.  Hemorrhagic cyst on the right kidney. Aortic Atherosclerosis (ICD10-I70.0) and Emphysema (ICD10-J43.9). Electronically Signed   By: Lucienne Capers M.D.   On: 03/14/2017 02:39    EKG: Independently reviewed. Atrial fibrillation.   Assessment/Plan  1. CAP, acute on chronic hypoxic respiratory failure - Pt presents with productive cough, dyspnea  - Found to have increased O2 requirement, right basilar pneumonia on CTA chest  - Parapneumonic effusion noted, had previously undergone pleurodesis at [facility], effusion small at this time   - Blood cultures collected in ED and empiric Rocephin and azithromycin given  - Plan to check sputum culture, check urine for strep pneumo and legionella antigens, trend procalcitonin, continue empiric abx   2. COPD with acute exacerbation  - Pt presents with productive cough and dyspnea, saturating 70% on rm air on arrival  - Exacerbation likely  precipitated by the infection  - Continue systemic steroids, supplemental O2, schedule DuoNeb, start albuterol nebs prn, abx as above    3. Atrial fibrillation  - In rate-controlled a fib on admission  - CHADS-VASc is at least 2 (age, HTN)  - Continue Xarelto and Toprol   4. CKD stage II  - SCr is 1.27 on admission, consistent with his apparent baseline  - Renally-dose medications as needed, avoid dehydration or hypotension    5. Thrombocytopenia  - Platelets are 126,000 on admission  - This is chronic and stable     DVT prophylaxis: Xarelto  Code Status: Full  Family Communication: Daughter updated at bedside Disposition Plan: Admit to med-surg Consults called: None Admission status: Inpatient    Vianne Bulls, MD [facility] Pager [phone]  If 7PM-7AM, please contact night-coverage [email or web address] Password [email or web address]  03/14/2017, 5:27 AM

## 2017-03-14 NOTE — Progress Notes (Signed)
PROGRESS NOTE  John Mcgrath NIO:[id number] DOB: 04-26-1946 DOA: 03/13/2017 PCP: Renee Rival, NP  Brief History:  71 year old male with a history of COPD, chronic respiratory failure on 3 L at home, CKD stage III, thrombocytopenic, atrial fibrillation on rivaroxaban, central hypertension, hyperlipidemia presenting with one-week history of shortness breath, coughing, dyspnea on exertion. Unfortunately, the patient continues to smoke three quarters per pack per day. In addition, the patient is not using his oxygen as directed. He states that he is only using oxygen at nighttime. He denies any fevers, chills, chest pain, headache, neck pain, nausea, vomiting, diarrhea, vomiting. Denies any recent sick contacts. Evaluation in the emergency department revealed BMP was essentially unremarkable with baseline creatinine. CBC was unremarkable. Lactic acid was 1.79. CT angiogram chest negative for pulmonary embolus but showed left basilar consolidation with left pleural effusion with emphysematous changes in the lungs. There was also a hazy infiltration around the patient's pancreas.   Assessment/Plan: Acute on chronic respiratory failure with hypoxia and hypercarbia -Secondary to COPD exacerbation and CHF exacerbation -Presently stable on 5 L HFNC -Wean oxygen to home demand -on 3 L [place] at home  Acute CHF -Start intravenous furosemide -Daily weights -Personally reviewed chest x-ray--small bilateral pleural effusions with interstitial prominence -Daily weights -Start intravenous furosemide -echo  COPD exacerbation -continue intravenous Solu-Medrol -Continue Duonebs -Start Pulmicort  Atrial fibrillation, unspecified type -Marginal control -Echocardiogram -TSH -hold amlodipine  CKD stage III -Baseline creatinine 1.1-1.3 -Monitor with diuresis -hold lotensin in setting of CKD  HLD -continue statin  Thrombocytopenia -Serum B12 -HIV -TSH  Hazy infiltration of the  pancreas -No clinical evidence of pancreatitis -Lipase normal -needs outpatient surveillance in the setting of history of peripheral lymphoproliferative disorder diagnosed 11/02/2013     Disposition Plan:   Home in 2-3 days  Family Communication:   No Family at bedside--Total time spent 35 minutes.  Greater than 50% spent face to face counseling and coordinating care. 0950 to 1025  Consultants:  none  Code Status:  FULL  DVT Prophylaxis:  Xarelto   Procedures: As Listed in Progress Note Above  Antibiotics: Ceftriaxone 10/15>>> Azithromycin 10/15>>>    Subjective: The patient says he is breathing better but continues to have dyspnea on exertion. Denies any fevers, chills, chest pain, nausea, vomiting, diarrhea, abdominal pain. Denies any headache or neck pain.  Objective: Vitals:   03/14/17 0505 03/14/17 0515 03/14/17 0613 03/14/17 0912  BP:   116/70   Pulse:  92 95   Resp:  19 18   Temp:   97.6 F (36.4 C)   TempSrc:   Oral   SpO2: (!) 0% 95% (!) 83% 92%  Weight:      Height:        Intake/Output Summary (Last 24 hours) at 03/14/17 1012 Last data filed at 03/14/17 0537  Gross per 24 hour  Intake              300 ml  Output              600 ml  Net             -300 ml   Weight change:  Exam:   General:  Pt is alert, follows commands appropriately, not in acute distress  HEENT: No icterus, No thrush, No neck mass, [place]/AT  Cardiovascular: IRRR, S1/S2, no rubs, no gallops  Respiratory: bilateral scattered rales but bibasilar wheezing. Good air movement.  Abdomen: Soft/+BS, non  tender, non distended, no guarding  Extremities: 1+LE edema, No lymphangitis, No petechiae, No rashes, no synovitis   Data Reviewed: I have personally reviewed following labs and imaging studies Basic Metabolic Panel:  Recent Labs Lab 03/14/17 0015  NA 137  K 3.8  CL 97*  CO2 29  GLUCOSE 96  BUN 19  CREATININE 1.27*  CALCIUM 9.2   Liver Function Tests:  Recent  Labs Lab 03/14/17 0329  AST 18  ALT 16*  ALKPHOS 74  BILITOT 0.9  PROT 6.6  ALBUMIN 3.8    Recent Labs Lab 03/14/17 0329  LIPASE 31   No results for input(s): AMMONIA in the last 168 hours. Coagulation Profile:  Recent Labs Lab 03/14/17 0015  INR 1.49   CBC:  Recent Labs Lab 03/14/17 0015  WBC 8.4  NEUTROABS 4.7  HGB 16.8  HCT 49.2  MCV 96.5  PLT 126*   Cardiac Enzymes:  Recent Labs Lab 03/14/17 0015  TROPONINI <0.03   BNP: Invalid input(s): POCBNP CBG: No results for input(s): GLUCAP in the last 168 hours. HbA1C: No results for input(s): HGBA1C in the last 72 hours. Urine analysis: No results found for: COLORURINE, APPEARANCEUR, LABSPEC, PHURINE, GLUCOSEU, HGBUR, BILIRUBINUR, KETONESUR, PROTEINUR, UROBILINOGEN, NITRITE, LEUKOCYTESUR Sepsis Labs: @LABRCNTIP (procalcitonin:4,lacticidven:4) ) Recent Results (from the past 240 hour(s))  Blood culture (routine x 2)     Status: None (Preliminary result)   Collection Time: 03/14/17  3:29 AM  Result Value Ref Range Status   Specimen Description BLOOD RIGHT ARM  Final   Special Requests   Final    BOTTLES DRAWN AEROBIC AND ANAEROBIC Blood Culture adequate volume   Culture NO GROWTH < 12 HOURS  Final   Report Status PENDING  Incomplete  Blood culture (routine x 2)     Status: None (Preliminary result)   Collection Time: 03/14/17  3:52 AM  Result Value Ref Range Status   Specimen Description BLOOD LEFT ARM  Final   Special Requests   Final    BOTTLES DRAWN AEROBIC AND ANAEROBIC Blood Culture adequate volume   Culture NO GROWTH < 12 HOURS  Final   Report Status PENDING  Incomplete     Scheduled Meds: . allopurinol  200 mg Oral Daily  . amLODipine  10 mg Oral Daily  . ipratropium-albuterol  3 mL Inhalation Q6H  . loratadine  10 mg Oral Daily  . methylPREDNISolone (SOLU-MEDROL) injection  40 mg Intravenous Q8H  . metoprolol succinate  100 mg Oral BID  . rivaroxaban  20 mg Oral BH-q7a  . simvastatin   20 mg Oral q1800   Continuous Infusions: . [START ON 03/15/2017] azithromycin    . [START ON 03/15/2017] cefTRIAXone (ROCEPHIN)  IV      Procedures/Studies: Dg Chest 2 View  Result Date: 03/14/2017 CLINICAL DATA:  Coughing up blood since Wednesday. No power since Thursday. Unable to use home oxygen. Decreased oxygen saturation. EXAM: CHEST  2 VIEW COMPARISON:  11/07/2015 FINDINGS: Mild cardiac enlargement. No vascular congestion or edema. Small bilateral pleural effusions with basilar atelectasis. No pneumothorax. No focal consolidation. Calcification of the aorta. Degenerative changes in the spine. IMPRESSION: Small bilateral pleural effusions with basilar atelectasis. Cardiac enlargement. No focal consolidation or edema. Electronically Signed   By: Lucienne Capers M.D.   On: 03/14/2017 00:31   Ct Angio Chest Pe W And/or Wo Contrast  Result Date: 03/14/2017 CLINICAL DATA:  Coughing up blood since Wednesday. Unable use home oxygen due to loss of power. Decreased oxygen saturations. EXAM: CT  ANGIOGRAPHY CHEST WITH CONTRAST TECHNIQUE: Multidetector CT imaging of the chest was performed using the standard protocol during bolus administration of intravenous contrast. Multiplanar CT image reconstructions and MIPs were obtained to evaluate the vascular anatomy. CONTRAST:  100 mL Isovue 370 COMPARISON:  11/07/2015 FINDINGS: Cardiovascular: Good opacification of the central and segmental pulmonary arteries. No focal filling defects. No evidence of significant pulmonary embolus. Mild cardiac enlargement. No pericardial effusions. Coronary artery calcifications. Calcification of the aorta. No evidence of aortic aneurysm or dissection. Mediastinum/Nodes: Enlarged left thyroid gland with dominant left thyroid nodule measuring 2.8 cm maximal diameter. No change since previous study. Esophagus is decompressed. No significant lymphadenopathy in the chest. Scattered lymph nodes are not pathologically enlarged.  Lungs/Pleura: Small left pleural effusion with basilar consolidation, likely representing pneumonia. Diffuse emphysematous changes throughout the lungs. No pneumothorax. Airways are patent. Upper Abdomen: Enlarged lateral segment of the left lobe of the liver may indicate cirrhotic changes. Tiny subcentimeter lesions are too small to characterize but probably represent cysts. Hemorrhagic cysts noted on the right kidney without change since prior study. There is mild hazy infiltration around the body and tail of the pancreas which could indicate early changes of acute pancreatitis. Musculoskeletal: Mild degenerative changes in the spine. No destructive bone lesions. Review of the MIP images confirms the above findings. IMPRESSION: 1. No evidence of significant pulmonary embolus. 2. Right pleural effusion with basilar consolidation suggesting pneumonia. 3. 2.8 cm diameter left thyroid nodule without change since prior study. 4. Hepatic configuration suggests cirrhosis. 5. Mild hazy infiltration around the body and tail the pancreas could indicate early changes of acute pancreatitis. No fluid collection or complication. 6. Hemorrhagic cyst on the right kidney. Aortic Atherosclerosis (ICD10-I70.0) and Emphysema (ICD10-J43.9). Electronically Signed   By: Lucienne Capers M.D.   On: 03/14/2017 02:39    Brad Lieurance, DO  [facility] Pager [phone]  If 7PM-7AM, please contact night-coverage [email or web address] Password [email or web address] 03/14/2017, 10:12 AM   LOS: 0 days

## 2017-03-14 NOTE — Progress Notes (Signed)
OT Cancellation Note  Patient Details Name: SARP VERNIER MRN: [id number] DOB: 06/24/1945   Cancelled Treatment:    Reason Eval/Treat Not Completed: OT screened, no needs identified, will sign off   Ailene Ravel, OTR/L,CBIS  [phone]  03/14/2017, 5:59 PM

## 2017-03-14 NOTE — ED Provider Notes (Signed)
[facility] DEPT Provider Note   CSN: [id number] Arrival date & time: 03/13/17  2340     History   Chief Complaint Chief Complaint  Patient presents with  . Hemoptysis    HPI John Mcgrath is a 71 y.o. male.  Patient with history of COPD on oxygen presenting with coughing up blood this started this evening. States he had an episode of this 5 days ago which resolved on its own. This evening he started having more hemoptysis of a couple teaspoons worth. He states he's not had power for the past 5 days and has not had his oxygen. Room air sats on arrival were 70%. Patient placed on 5 L of oxygen sats increased to 90%. He denies chest pain or fever. He denies any abdominal pain nausea or vomiting. He does take xarelto for history of atrial fibrillation. Patient with history of recurrent pleural effusion drained last year. Catheter was in place for 6 weeks but was no longer in place now. Patient states  This was not cancerous. He states he has not had any recurrent issues since. He is the first time he has had hemoptysis. He feels better now with oxygen in place. Denies any dizziness or lightheadedness.   The history is provided by the patient.    Past Medical History:  Diagnosis Date  . Asthma   . Borderline diabetic   . Cataract    starting to form per pt per eye md  . COPD (chronic obstructive pulmonary disease) ([place])   . Gout    pt denies  . High cholesterol   . Hypertension   . Leukocytosis   . Polycythemia   . TIA (transient ischemic attack) 20 y ago    Patient Active Problem List   Diagnosis Date Noted  . Obstructive sleep apnea 10/09/2013  . 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 Thrombocytopenia, immune ([place]) 09/21/2013  . Leukocytosis 08/28/2013  . Polycythemia 08/28/2013  . Chronic obstructive pulmonary disease ([place]) 08/28/2013  . Hypoxia 08/28/2013    Past Surgical History:  Procedure Laterality Date  . HERNIA REPAIR  @ 71 years of age & then  again @ 71 years of age       Home Medications    Prior to Admission medications   Medication Sig Start Date End Date Taking? Authorizing Provider  allopurinol (ZYLOPRIM) 100 MG tablet Take 200 mg by mouth daily.     [provider]  amLODipine-benazepril (LOTREL) 10-40 MG per capsule Take 1 capsule by mouth daily.    [provider]  fexofenadine (ALLEGRA) 180 MG tablet Take 180 mg by mouth daily. 08/14/15   [provider]  furosemide (LASIX) 20 MG tablet Take 20 mg by mouth daily.  11/06/15   [provider]  indomethacin (INDOCIN) 25 MG capsule Take 25 mg by mouth 3 (three) times daily as needed. For gout    [provider]  ipratropium-albuterol (DUONEB) 0.5-2.5 (3) MG/3ML SOLN Inhale 3 mLs into the lungs daily.  10/02/15   [provider]  metoprolol succinate (TOPROL-XL) 100 MG 24 hr tablet Take 100 mg by mouth 2 (two) times daily. Take with or immediately following a meal.    [provider]  simvastatin (ZOCOR) 20 MG tablet Take 20 mg by mouth daily.    [provider]  tiotropium (SPIRIVA) 18 MCG inhalation capsule Place 18 mcg into inhaler and inhale daily.    [provider]  XARELTO 20 MG TABS tablet Take 20 mg  by mouth every morning.  10/06/15   [provider]    Family History No family history on file.  Social History Social History  Substance Use Topics  . Smoking status: Current Every Day Smoker    Packs/day: 1.00    Years: 50.00  . Smokeless tobacco: Never Used  . Alcohol use 12.6 oz/week    21 Cans of beer per week     Allergies   Patient has no known allergies.   Review of Systems Review of Systems  Constitutional: Negative for activity change, appetite change and fever.  HENT: Positive for congestion. Negative for sinus pressure and sneezing.   Respiratory: Positive for cough and shortness of breath. Negative for chest tightness.   Cardiovascular: Negative for chest  pain.  Gastrointestinal: Negative for abdominal pain, nausea and vomiting.  Genitourinary: Negative for dysuria, hematuria and testicular pain.  Musculoskeletal: Negative for arthralgias and myalgias.  Skin: Negative for rash.  Neurological: Negative for dizziness, weakness and headaches.   all other systems are negative except as noted in the HPI and PMH.     Physical Exam Updated Vital Signs BP (!) 144/90 (BP Location: Left Arm)   Pulse 81   Temp 98 F (36.7 C) (Oral)   Resp (!) 22   Ht 5\' 8"  (1.727 m)   Wt 97.5 kg (215 lb)   SpO2 (!) 70%   BMI 32.69 kg/m   Physical Exam  Constitutional: He is oriented to person, place, and time. He appears well-developed and well-nourished. No distress.  Chronically ill appearing. Patient has cup at bedside containing about 2 teaspoons of bright red blood Dyspneic with conversation  HENT:  Head: Normocephalic and atraumatic.  Mouth/Throat: Oropharynx is clear and moist. No oropharyngeal exudate.  Eyes: Pupils are equal, round, and reactive to light. Conjunctivae and EOM are normal.  Neck: Normal range of motion. Neck supple.  No meningismus.  Cardiovascular: Normal rate, normal heart sounds and intact distal pulses.   No murmur heard. Irregular rhythm  Pulmonary/Chest: Effort normal. No respiratory distress. He has wheezes.  Diminished breath sounds on R.  Abdominal: Soft. There is no tenderness. There is no rebound and no guarding.  Musculoskeletal: Normal range of motion. He exhibits no edema or tenderness.  Neurological: He is alert and oriented to person, place, and time. No cranial nerve deficit. He exhibits normal muscle tone. Coordination normal.   5/5 strength throughout. CN 2-12 intact.Equal grip strength.   Skin: Skin is warm.  Psychiatric: He has a normal mood and affect. His behavior is normal.  Nursing note and vitals reviewed.    ED Treatments / Results  Labs (all labs ordered are listed, but only abnormal results  are displayed) Labs Reviewed  CBC WITH DIFFERENTIAL/PLATELET - Abnormal; Notable for the following:       Result Value   Platelets 126 (*)    All other components within normal limits  PROTIME-INR - Abnormal; Notable for the following:    Prothrombin Time 17.9 (*)    All other components within normal limits  BASIC METABOLIC PANEL - Abnormal; Notable for the following:    Chloride 97 (*)    Creatinine, Ser 1.27 (*)    GFR calc non Af Amer 56 (*)    All other components within normal limits  BRAIN NATRIURETIC PEPTIDE - Abnormal; Notable for the following:    B Natriuretic Peptide 349.0 (*)    All other components within normal limits  BLOOD GAS, ARTERIAL - Abnormal; Notable for  the following:    pH, Arterial 7.326 (*)    pCO2 arterial 55.8 (*)    pO2, Arterial 49.0 (*)    Acid-Base Excess 2.9 (*)    All other components within normal limits  HEPATIC FUNCTION PANEL - Abnormal; Notable for the following:    ALT 16 (*)    All other components within normal limits  CULTURE, BLOOD (ROUTINE X 2)  CULTURE, BLOOD (ROUTINE X 2)  CULTURE, EXPECTORATED SPUTUM-ASSESSMENT  GRAM STAIN  TROPONIN I  LIPASE, BLOOD  PROCALCITONIN  STREP PNEUMONIAE URINARY ANTIGEN  LEGIONELLA PNEUMOPHILA SEROGP 1 UR AG  I-STAT CG4 LACTIC ACID, ED    EKG  EKG Interpretation  Date/Time:  Monday March 14 2017 00:05:52 EDT Ventricular Rate:  87 PR Interval:    QRS Duration: 91 QT Interval:  386 QTC Calculation: 465 R Axis:   57 Text Interpretation:  Atrial fibrillation Anterior infarct, old No significant change was found Confirmed by Ezequiel Essex [phone]) on 03/14/2017 12:51:11 AM       Radiology Dg Chest 2 View  Result Date: 03/14/2017 CLINICAL DATA:  Coughing up blood since Wednesday. No power since Thursday. Unable to use home oxygen. Decreased oxygen saturation. EXAM: CHEST  2 VIEW COMPARISON:  11/07/2015 FINDINGS: Mild cardiac enlargement. No vascular congestion or edema. Small bilateral  pleural effusions with basilar atelectasis. No pneumothorax. No focal consolidation. Calcification of the aorta. Degenerative changes in the spine. IMPRESSION: Small bilateral pleural effusions with basilar atelectasis. Cardiac enlargement. No focal consolidation or edema. Electronically Signed   By: Lucienne Capers M.D.   On: 03/14/2017 00:31   Ct Angio Chest Pe W And/or Wo Contrast  Result Date: 03/14/2017 CLINICAL DATA:  Coughing up blood since Wednesday. Unable use home oxygen due to loss of power. Decreased oxygen saturations. EXAM: CT ANGIOGRAPHY CHEST WITH CONTRAST TECHNIQUE: Multidetector CT imaging of the chest was performed using the standard protocol during bolus administration of intravenous contrast. Multiplanar CT image reconstructions and MIPs were obtained to evaluate the vascular anatomy. CONTRAST:  100 mL Isovue 370 COMPARISON:  11/07/2015 FINDINGS: Cardiovascular: Good opacification of the central and segmental pulmonary arteries. No focal filling defects. No evidence of significant pulmonary embolus. Mild cardiac enlargement. No pericardial effusions. Coronary artery calcifications. Calcification of the aorta. No evidence of aortic aneurysm or dissection. Mediastinum/Nodes: Enlarged left thyroid gland with dominant left thyroid nodule measuring 2.8 cm maximal diameter. No change since previous study. Esophagus is decompressed. No significant lymphadenopathy in the chest. Scattered lymph nodes are not pathologically enlarged. Lungs/Pleura: Small left pleural effusion with basilar consolidation, likely representing pneumonia. Diffuse emphysematous changes throughout the lungs. No pneumothorax. Airways are patent. Upper Abdomen: Enlarged lateral segment of the left lobe of the liver may indicate cirrhotic changes. Tiny subcentimeter lesions are too small to characterize but probably represent cysts. Hemorrhagic cysts noted on the right kidney without change since prior study. There is mild  hazy infiltration around the body and tail of the pancreas which could indicate early changes of acute pancreatitis. Musculoskeletal: Mild degenerative changes in the spine. No destructive bone lesions. Review of the MIP images confirms the above findings. IMPRESSION: 1. No evidence of significant pulmonary embolus. 2. Right pleural effusion with basilar consolidation suggesting pneumonia. 3. 2.8 cm diameter left thyroid nodule without change since prior study. 4. Hepatic configuration suggests cirrhosis. 5. Mild hazy infiltration around the body and tail the pancreas could indicate early changes of acute pancreatitis. No fluid collection or complication. 6. Hemorrhagic cyst on the right kidney.  Aortic Atherosclerosis (ICD10-I70.0) and Emphysema (ICD10-J43.9). Electronically Signed   By: Lucienne Capers M.D.   On: 03/14/2017 02:39    Procedures Procedures (including critical care time)  Medications Ordered in ED Medications - No data to display   Initial Impression / Assessment and Plan / ED Course  I have reviewed the triage vital signs and the nursing notes.  Pertinent labs & imaging results that were available during my care of the patient were reviewed by me and considered in my medical decision making (see chart for details).    Hx COPD, Afib on Xarelto presenting with SOB and hemoptysis.  No chest pain.  Nebs, steroids, CXR. EKG unchanged.  Small effusion on CXR. No PTX. Small amount of hemoptysis. Vitals stable.  Requiring 8L O2, up from 3L baseline. ABG with hypoxemia.  CT obtained and shows no PE. Does show R pleural effusion and PNA. Will treat with antibiotics after obtaining cultures. This is the same side patient had effusion on previously.   Plan admission for COPD exacerbation with PNA and worsening oxygen requirement. D/w Dr. Myna Hidalgo.  CRITICAL CARE Performed by: Ezequiel Essex Total critical care time: 35 minutes Critical care time was exclusive of separately billable  procedures and treating other patients. Critical care was necessary to treat or prevent imminent or life-threatening deterioration. Critical care was time spent personally by me on the following activities: development of treatment plan with patient and/or surrogate as well as nursing, discussions with consultants, evaluation of patient's response to treatment, examination of patient, obtaining history from patient or surrogate, ordering and performing treatments and interventions, ordering and review of laboratory studies, ordering and review of radiographic studies, pulse oximetry and re-evaluation of patient's condition.  Final Clinical Impressions(s) / ED Diagnoses   Final diagnoses:  Community acquired pneumonia of right lower lobe of lung ([place])  Pleural effusion  Hemoptysis    New Prescriptions New Prescriptions   No medications on file     Ezequiel Essex, MD 03/14/17 1019

## 2017-03-14 NOTE — Clinical Social Work Note (Signed)
Patient referred for COPD Gold protocol. Patient does not have the required three admissions within a six month period for COPD Gold criteria.     Ronda Kazmi, Clydene Pugh, LCSW

## 2017-03-14 NOTE — Progress Notes (Signed)
Initial Nutrition Assessment  DOCUMENTATION CODES:  Obesity class I    INTERVENTION:  Glucerna Shake po daily, each supplement provides 220 kcal and 10 grams of protein   NUTRITION DIAGNOSIS:   Increased nutrient needs related to acute illness (PNA and increased SOB) as evidenced by estimated needs.   GOAL:   Patient will meet greater than or equal to 90% of their needs   MONITOR:   PO intake, Supplement acceptance, Labs, Skin  REASON FOR ASSESSMENT:   Consult Assessment of nutrition requirement/status  ASSESSMENT:   71 yo with hx of COPD, CKD-2 who presents with community aquired pneumonia. He lives with wife who is disabled.  His weight is stable and intake very good despite shortness of breath. He reports 100% of meals today. At home he usually eats only "one good meal". His wife is not able to Dirk any longer due to chronic medical problems. He is vague about what he eats and says I eat whatever and when ever I feel like it. He does not eat pork and likes tea to drink ,rarely eats fruits and vegetables.  He has ample fat mass and no edema.  Recent Labs Lab 03/14/17 0015  NA 137  K 3.8  CL 97*  CO2 29  BUN 19  CREATININE 1.27*  CALCIUM 9.2  GLUCOSE 96   Labs and meds reviewed  Diet Order:  Diet Heart Room service appropriate? Yes; Fluid consistency: Thin  Skin:   (redness to right hand)  Last BM:  10/14  Height:   Ht Readings from Last 1 Encounters:  03/13/17 5\' 8"  (1.727 m)    Weight:   Wt Readings from Last 1 Encounters:  03/13/17 215 lb (97.5 kg)    Ideal Body Weight:  70 kg  BMI:  Body mass index is 32.69 kg/m.  Estimated Nutritional Needs:   Kcal:  2156-2352  Protein:  108-118 gr  Fluid:  >2.0 liters daily  EDUCATION NEEDS:   No education needs identified at this time  Colman Cater MS,RD,CSG,LDN Office: #[phone] Pager: [phone]
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
## 2017-03-15 ENCOUNTER — Inpatient Hospital Stay (HOSPITAL_COMMUNITY): Payer: Medicare Other

## 2017-03-15 LAB — PROCALCITONIN: Procalcitonin: <0.1 ng/mL

## 2017-03-15 LAB — BASIC METABOLIC PANEL
ANION GAP: 10 (ref 5–15)
BUN: 31 mg/dL — ABNORMAL HIGH (ref 6–20)
CHLORIDE: 92 mmol/L — AB (ref 101–111)
CO2: 27 mmol/L (ref 22–32)
Calcium: 8.7 mg/dL — ABNORMAL LOW (ref 8.9–10.3)
Creatinine, Ser: 1.55 mg/dL — ABNORMAL HIGH (ref 0.61–1.24)
GFR calc non Af Amer: 44 mL/min — ABNORMAL LOW (ref >60)
GFR, EST AFRICAN AMERICAN: 51 mL/min — AB (ref >60)
Glucose, Bld: 306 mg/dL — ABNORMAL HIGH (ref 65–99)
POTASSIUM: 4.6 mmol/L (ref 3.5–5.1)
Sodium: 129 mmol/L — ABNORMAL LOW (ref 135–145)

## 2017-03-15 LAB — T4, FREE: FREE T4: 1.23 ng/dL — AB (ref 0.61–1.12)

## 2017-03-15 LAB — HEPATITIS B SURFACE ANTIGEN: HEP B S AG: NEGATIVE

## 2017-03-15 LAB — TSH: TSH: 0.025 u[IU]/mL — AB (ref 0.350–4.500)

## 2017-03-15 LAB — HEPATITIS C ANTIBODY

## 2017-03-15 LAB — MAGNESIUM: Magnesium: 1.8 mg/dL (ref 1.7–2.4)

## 2017-03-15 LAB — HIV ANTIBODY (ROUTINE TESTING W REFLEX): HIV SCREEN 4TH GENERATION: NONREACTIVE

## 2017-03-15 LAB — STREP PNEUMONIAE URINARY ANTIGEN: Strep Pneumo Urinary Antigen: NEGATIVE

## 2017-03-15 MED ORDER — AZITHROMYCIN 500 MG PO TABS
500.0000 mg | ORAL_TABLET | Freq: Every day | ORAL | 0 refills | Status: DC
Start: 2017-03-16 — End: 2017-06-08

## 2017-03-15 MED ORDER — PREDNISONE 10 MG PO TABS: 60.0000 mg | ORAL_TABLET | Freq: Every day | ORAL | 0 refills | Status: DC

## 2017-03-15 MED ORDER — AZITHROMYCIN 250 MG PO TABS: 500.0000 mg | ORAL_TABLET | Freq: Every day | ORAL | Status: DC

## 2017-03-15 MED ORDER — IPRATROPIUM-ALBUTEROL 0.5-2.5 (3) MG/3ML IN SOLN: 3.0000 mL | Freq: Two times a day (BID) | RESPIRATORY_TRACT | Status: DC
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
## 2017-03-15 NOTE — Care Management Important Message (Signed)
Important Message  Patient Details  Name: John Mcgrath MRN: [id number] Date of Birth: [birth year]   Medicare Important Message Given:  Yes    Alisha Burgo, Chauncey Reading, RN 03/15/2017, 10:44 AM

## 2017-03-15 NOTE — Progress Notes (Signed)
Patient IV removed, tolerated well. Discharge instructions given at bedside.  

## 2017-03-15 NOTE — Progress Notes (Signed)
Inpatient Diabetes Program Recommendations  AACE/ADA: New Consensus Statement on Inpatient Glycemic Control (2015)  Target Ranges:  Prepandial:   less than 140 mg/dL      Peak postprandial:   less than 180 mg/dL (1-2 hours)      Critically ill patients:  140 - 180 mg/dL  Results for John Mcgrath, John Mcgrath (MRN [id number]) as of 03/15/2017 08:37  Ref. Range 03/14/2017 00:15 03/15/2017 04:23  Glucose Latest Ref Range: 65 - 99 mg/dL 96 306 (H)    Review of Glycemic Control  Diabetes history: No Outpatient Diabetes medications: NA Current orders for Inpatient glycemic control: None  Inpatient Diabetes Program Recommendations: Correction (SSI): While inpatient and ordered steroids, please consider ordering CBGs with Novolog correction scale ACHS.  Thanks, Barnie Alderman, RN, MSN, CDE Diabetes Coordinator Inpatient Diabetes Program [phone] (Team Pager from 8am to 5pm)

## 2017-03-15 NOTE — Discharge Summary (Addendum)
Physician Discharge Summary  CISCO KINDT RCV:[id number] DOB: Apr 27, 1946 DOA: 03/13/2017  PCP: Renee Rival, NP  Admit date: 03/13/2017 Discharge date: 03/15/2017  Admitted From: Home Disposition:  Home   Recommendations for Outpatient Follow-up:  1. Follow up with PCP in 1-2 weeks 2. Please obtain BMP/CBC in one week 3. Follow up with Hematology, Dr. Talbert Cage, 04/06/17 at 1PM 4. Follow up with Endocrine, Dr. Dorris Fetch, in one week  Home Health: YES Equipment/Devices: 3L   Discharge Condition: Stable CODE STATUS:FULL Diet recommendation: Heart Healthy / Carb Modified    Brief/Interim Summary: 71 year old male with a history of COPD, chronic respiratory failure on 3 L at home, CKD stage III, thrombocytopenic, atrial fibrillation on rivaroxaban, central hypertension, hyperlipidemia presenting with one-week history of shortness breath, coughing, dyspnea on exertion. Unfortunately, the patient continues to smoke three quarters per pack per day. In addition, the patient is not using his oxygen as directed. He states that he is only using oxygen at nighttime. He denies any fevers, chills, chest pain, headache, neck pain, nausea, vomiting, diarrhea, vomiting. Denies any recent sick contacts. Evaluation in the emergency department revealed BMP was essentially unremarkable with baseline creatinine. CBC was unremarkable. Lactic acid was 1.79. CT angiogram chest negative for pulmonary embolus but showed left basilar consolidation with left pleural effusion with emphysematous changes in the lungs. There was also a hazy infiltration around the patient's pancreas. The patient was placed on intravenous furosemide and Solu-Medrol with significant clinical improvement in her period of 24-48 hours. The patient did not want to stay in the hospital for further workup or echocardiogram. The patient was discharged home with instructions to follow-up with endocrine and hematology.  Discharge Diagnoses:  Acute on  chronic respiratory failure with hypoxia and hypercarbia -Secondary to COPD exacerbation and CHF exacerbation -Presently stable on 5 L HFNC-->weaned to 3L with oxygen saturation 91-92% -Wean oxygen to home demand -on 3 L Tok at home with oxygen saturation 10-25%  Acute diastolic CHF -Daily weights -Personally reviewed chest x-ray--small bilateral pleural effusions with interstitial prominence -Start intravenous furosemide--had 2 doses 40mg  IV with clinical improvement -echo--pending--patient did not want to wait for echo.  Can be done in outpatient setting -restart home dose lasix after d/c -personally reviewed CXR--small bilateral pleural effusion with increased interstitial markings  COPD exacerbation -continue intravenous Solu-Medrol>>>home with prednisone taper -Continue Duonebs -Start Pulmicort -home with 3 more days zithromax  Hyperthyroidism/Left Thyroid Nodule -check Free T3, Free T4 -discussed with Endocrine--Dr. Nida-->he will contact pt for appointment this week -will need outpt RAIU  Lymphoproliferative disorder -a follow-up appointment was made with Hematology Dr. 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; however, he has been lost to follow-up -The patient had [facility] forpleural effusions for which he required a Pleurx catheter--cytology from his pleural fluid was inconclusive but suspicious for possible lymphoproliferative disorder -Now patient has CT scan with increasing haziness around the pancreas as well as a large left hepatic lobe--concerned this constellation of findings may represent lymphoproliferative disorder -pt and family updated and aware  Atrial fibrillation, unspecified type -Echocardiogram--pt did not want to wait for echo -TSH -hold amlodipine  CKD stage III -Baseline creatinine 1.1-1.3 -Monitor with diuresis -hold lotensin in  setting of CKD  HLD -continue statin  Thrombocytopenia -Serum B12--396 -HIV--neg -TSH--<00.10  Hazy infiltration of the pancreas -No clinical evidence of pancreatitis -Lipase normal -needs outpatient surveillance in the setting of history of peripheral  lymphoproliferative disorder diagnosed 11/02/2013   Discharge Instructions  Discharge Instructions    Diet - low sodium heart healthy    Complete by:  As directed    Increase activity slowly    Complete by:  As directed      Allergies as of 03/15/2017   No Known Allergies     Medication List    STOP taking these medications   amLODipine-benazepril 10-40 MG capsule Commonly known as:  LOTREL   indomethacin 25 MG capsule Commonly known as:  INDOCIN     TAKE these medications   allopurinol 100 MG tablet Commonly known as:  ZYLOPRIM Take 200 mg by mouth daily.   azithromycin 500 MG tablet Commonly known as:  ZITHROMAX Take 1 tablet (500 mg total) by mouth daily.   fexofenadine 180 MG tablet Commonly known as:  ALLEGRA Take 180 mg by mouth daily.   furosemide 20 MG tablet Commonly known as:  LASIX Take 20 mg by mouth daily.   ipratropium-albuterol 0.5-2.5 (3) MG/3ML Soln Commonly known as:  DUONEB Inhale 3 mLs into the lungs daily.   metoprolol succinate 100 MG 24 hr tablet Commonly known as:  TOPROL-XL Take 100 mg by mouth 2 (two) times daily. Take with or immediately following a meal.   pravastatin 20 MG tablet Commonly known as:  PRAVACHOL Take 20 mg by mouth at bedtime.   predniSONE 10 MG tablet Commonly known as:  DELTASONE Take 6 tablets (60 mg total) by mouth daily. And decrease by 1 tablet daily   tiotropium 18 MCG inhalation capsule Commonly known as:  SPIRIVA Place 18 mcg into inhaler and inhale daily.   XARELTO 20 MG Tabs tablet Generic drug:  rivaroxaban Take 20 mg by mouth every morning.      Follow-up Information    zhou, Barbaraann Share Follow up on 04/06/2017.   Why:  at 1:00  pm Contact information: [address] [place] [address] [phone]       Cassandria Anger, MD Follow up in 1 week(s).   Specialty:  Endocrinology Contact information: [place] [address] [phone]          No Known Allergies  Consultations:  Endocrine, Dr. Dorris Fetch   Procedures/Studies: Dg Chest 2 View  Result Date: 03/14/2017 CLINICAL DATA:  Coughing up blood since Wednesday. No power since Thursday. Unable to use home oxygen. Decreased oxygen saturation. EXAM: CHEST  2 VIEW COMPARISON:  11/07/2015 FINDINGS: Mild cardiac enlargement. No vascular congestion or edema. Small bilateral pleural effusions with basilar atelectasis. No pneumothorax. No focal consolidation. Calcification of the aorta. Degenerative changes in the spine. IMPRESSION: Small bilateral pleural effusions with basilar atelectasis. Cardiac enlargement. No focal consolidation or edema. Electronically Signed   By: Lucienne Capers M.D.   On: 03/14/2017 00:31   Ct Angio Chest Pe W And/or Wo Contrast  Result Date: 03/14/2017 CLINICAL DATA:  Coughing up blood since Wednesday. Unable use home oxygen due to loss of power. Decreased oxygen saturations. EXAM: CT ANGIOGRAPHY CHEST WITH CONTRAST TECHNIQUE: Multidetector CT imaging of the chest was performed using the standard protocol during bolus administration of intravenous contrast. Multiplanar CT image reconstructions and MIPs were obtained to evaluate the vascular anatomy. CONTRAST:  100 mL Isovue 370 COMPARISON:  11/07/2015 FINDINGS: Cardiovascular: Good opacification of the central and segmental pulmonary arteries. No focal filling defects. No evidence of significant pulmonary embolus. Mild cardiac enlargement. No pericardial effusions. Coronary artery calcifications. Calcification of the aorta. No evidence of aortic aneurysm  or dissection. Mediastinum/Nodes: Enlarged left thyroid gland with dominant left thyroid nodule measuring  2.8 cm maximal diameter. No change since previous study. Esophagus is decompressed. No significant lymphadenopathy in the chest. Scattered lymph nodes are not pathologically enlarged. Lungs/Pleura: Small left pleural effusion with basilar consolidation, likely representing pneumonia. Diffuse emphysematous changes throughout the lungs. No pneumothorax. Airways are patent. Upper Abdomen: Enlarged lateral segment of the left lobe of the liver may indicate cirrhotic changes. Tiny subcentimeter lesions are too small to characterize but probably represent cysts. Hemorrhagic cysts noted on the right kidney without change since prior study. There is mild hazy infiltration around the body and tail of the pancreas which could indicate early changes of acute pancreatitis. Musculoskeletal: Mild degenerative changes in the spine. No destructive bone lesions. Review of the MIP images confirms the above findings. IMPRESSION: 1. No evidence of significant pulmonary embolus. 2. Right pleural effusion with basilar consolidation suggesting pneumonia. 3. 2.8 cm diameter left thyroid nodule without change since prior study. 4. Hepatic configuration suggests cirrhosis. 5. Mild hazy infiltration around the body and tail the pancreas could indicate early changes of acute pancreatitis. No fluid collection or complication. 6. Hemorrhagic cyst on the right kidney. Aortic Atherosclerosis (ICD10-I70.0) and Emphysema (ICD10-J43.9). Electronically Signed   By: Lucienne Capers M.D.   On: 03/14/2017 02:39        Discharge Exam: Vitals:   03/15/17 0814 03/15/17 0819  BP:    Pulse:    Resp:    Temp:    SpO2: (!) 80% (!) 80%   Vitals:   03/14/17 1942 03/15/17 0542 03/15/17 0814 03/15/17 0819  BP:  116/70    Pulse: 94 91    Resp: 18 18    Temp:  98.2 F (36.8 C)    TempSrc:  Oral    SpO2: 94% 98% (!) 80% (!) 80%  Weight:  97.5 kg (214 lb 15.5 oz)    Height:        General: Pt is alert, awake, not in acute  distress Cardiovascular: RRR, S1/S2 +, no rubs, no gallops Respiratory: CTA bilaterally, no wheezing, no rhonchi Abdominal: Soft, NT, ND, bowel sounds + Extremities: no edema, no cyanosis   The results of significant diagnostics from this hospitalization (including imaging, microbiology, ancillary and laboratory) are listed below for reference.    Significant Diagnostic Studies: Dg Chest 2 View  Result Date: 03/14/2017 CLINICAL DATA:  Coughing up blood since Wednesday. No power since Thursday. Unable to use home oxygen. Decreased oxygen saturation. EXAM: CHEST  2 VIEW COMPARISON:  11/07/2015 FINDINGS: Mild cardiac enlargement. No vascular congestion or edema. Small bilateral pleural effusions with basilar atelectasis. No pneumothorax. No focal consolidation. Calcification of the aorta. Degenerative changes in the spine. IMPRESSION: Small bilateral pleural effusions with basilar atelectasis. Cardiac enlargement. No focal consolidation or edema. Electronically Signed   By: Lucienne Capers M.D.   On: 03/14/2017 00:31   Ct Angio Chest Pe W And/or Wo Contrast  Result Date: 03/14/2017 CLINICAL DATA:  Coughing up blood since Wednesday. Unable use home oxygen due to loss of power. Decreased oxygen saturations. EXAM: CT ANGIOGRAPHY CHEST WITH CONTRAST TECHNIQUE: Multidetector CT imaging of the chest was performed using the standard protocol during bolus administration of intravenous contrast. Multiplanar CT image reconstructions and MIPs were obtained to evaluate the vascular anatomy. CONTRAST:  100 mL Isovue 370 COMPARISON:  11/07/2015 FINDINGS: Cardiovascular: Good opacification of the central and segmental pulmonary arteries. No focal filling defects. No evidence of significant pulmonary embolus. Mild  cardiac enlargement. No pericardial effusions. Coronary artery calcifications. Calcification of the aorta. No evidence of aortic aneurysm or dissection. Mediastinum/Nodes: Enlarged left thyroid gland with  dominant left thyroid nodule measuring 2.8 cm maximal diameter. No change since previous study. Esophagus is decompressed. No significant lymphadenopathy in the chest. Scattered lymph nodes are not pathologically enlarged. Lungs/Pleura: Small left pleural effusion with basilar consolidation, likely representing pneumonia. Diffuse emphysematous changes throughout the lungs. No pneumothorax. Airways are patent. Upper Abdomen: Enlarged lateral segment of the left lobe of the liver may indicate cirrhotic changes. Tiny subcentimeter lesions are too small to characterize but probably represent cysts. Hemorrhagic cysts noted on the right kidney without change since prior study. There is mild hazy infiltration around the body and tail of the pancreas which could indicate early changes of acute pancreatitis. Musculoskeletal: Mild degenerative changes in the spine. No destructive bone lesions. Review of the MIP images confirms the above findings. IMPRESSION: 1. No evidence of significant pulmonary embolus. 2. Right pleural effusion with basilar consolidation suggesting pneumonia. 3. 2.8 cm diameter left thyroid nodule without change since prior study. 4. Hepatic configuration suggests cirrhosis. 5. Mild hazy infiltration around the body and tail the pancreas could indicate early changes of acute pancreatitis. No fluid collection or complication. 6. Hemorrhagic cyst on the right kidney. Aortic Atherosclerosis (ICD10-I70.0) and Emphysema (ICD10-J43.9). Electronically Signed   By: Lucienne Capers M.D.   On: 03/14/2017 02:39     Microbiology: Recent Results (from the past 240 hour(s))  Blood culture (routine x 2)     Status: None (Preliminary result)   Collection Time: 03/14/17  3:29 AM  Result Value Ref Range Status   Specimen Description BLOOD RIGHT ARM  Final   Special Requests   Final    BOTTLES DRAWN AEROBIC AND ANAEROBIC Blood Culture adequate volume   Culture NO GROWTH 1 DAY  Final   Report Status PENDING   Incomplete  Blood culture (routine x 2)     Status: None (Preliminary result)   Collection Time: 03/14/17  3:52 AM  Result Value Ref Range Status   Specimen Description BLOOD LEFT ARM  Final   Special Requests   Final    BOTTLES DRAWN AEROBIC AND ANAEROBIC Blood Culture adequate volume   Culture NO GROWTH 1 DAY  Final   Report Status PENDING  Incomplete     Labs: Basic Metabolic Panel:  Recent Labs Lab 03/14/17 0015 03/15/17 0423  NA 137 129*  K 3.8 4.6  CL 97* 92*  CO2 29 27  GLUCOSE 96 306*  BUN 19 31*  CREATININE 1.27* 1.55*  CALCIUM 9.2 8.7*  MG  --  1.8   Liver Function Tests:  Recent Labs Lab 03/14/17 0329  AST 18  ALT 16*  ALKPHOS 74  BILITOT 0.9  PROT 6.6  ALBUMIN 3.8    Recent Labs Lab 03/14/17 0329  LIPASE 31   No results for input(s): AMMONIA in the last 168 hours. CBC:  Recent Labs Lab 03/14/17 0015  WBC 8.4  NEUTROABS 4.7  HGB 16.8  HCT 49.2  MCV 96.5  PLT 126*   Cardiac Enzymes:  Recent Labs Lab 03/14/17 0015  TROPONINI <0.03   BNP: Invalid input(s): POCBNP CBG: No results for input(s): GLUCAP in the last 168 hours.  Time coordinating discharge:  Greater than 30 minutes  Signed:  Asalee Barrette, DO [facility] Pager: [phone] 03/15/2017, 1:54 PM

## 2017-03-16 LAB — LEGIONELLA PNEUMOPHILA SEROGP 1 UR AG: L. PNEUMOPHILA SEROGP 1 UR AG: NEGATIVE

## 2017-03-16 LAB — T3, FREE: T3 FREE: 3 pg/mL (ref 2.0–4.4)

## 2017-03-17 ENCOUNTER — Encounter: Payer: Self-pay | Admitting: "Endocrinology

## 2017-03-17 ENCOUNTER — Ambulatory Visit (INDEPENDENT_AMBULATORY_CARE_PROVIDER_SITE_OTHER): Payer: Medicare Other | Admitting: "Endocrinology

## 2017-03-17 VITALS — BP 134/84 | HR 100 | Ht 69.0 in | Wt 216.0 lb

## 2017-03-17 DIAGNOSIS — E0590 Thyrotoxicosis, unspecified without thyrotoxic crisis or storm: Secondary | ICD-10-CM | POA: Diagnosis not present

## 2017-03-17 NOTE — Progress Notes (Signed)
Subjective:    Patient ID: John Mcgrath, male    DOB: 05-06-1946, PCP Renee Rival, NP.   Past Medical History:  Diagnosis Date  . Asthma   . Borderline diabetic   . Cataract    starting to form per pt per eye md  . COPD (chronic obstructive pulmonary disease) ([place])   . Gout    pt denies  . High cholesterol   . Hypertension   . Leukocytosis   . Polycythemia   . TIA (transient ischemic attack) 20 y ago   Past Surgical History:  Procedure Laterality Date  . HERNIA REPAIR  @ 71 years of age & then again @ 71 years of age   Social History   Social History  . Marital status: Married    Spouse name: N/A  . Number of children: N/A  . Years of education: N/A   Social History Main Topics  . Smoking status: Current Every Day Smoker    Packs/day: 1.00    Years: 50.00  . Smokeless tobacco: Never Used  . Alcohol use 12.6 oz/week    21 Cans of beer per week  . Drug use: No  . Sexual activity: Not Asked   Other Topics Concern  . None   Social History Narrative  . None   Outpatient Encounter Prescriptions as of 03/17/2017  Medication Sig  . allopurinol (ZYLOPRIM) 100 MG tablet Take 200 mg by mouth daily.   Marland Kitchen azithromycin (ZITHROMAX) 500 MG tablet Take 1 tablet (500 mg total) by mouth daily.  . fexofenadine (ALLEGRA) 180 MG tablet Take 180 mg by mouth daily.  . furosemide (LASIX) 20 MG tablet Take 20 mg by mouth daily.   Marland Kitchen ipratropium-albuterol (DUONEB) 0.5-2.5 (3) MG/3ML SOLN Inhale 3 mLs into the lungs daily.   . metoprolol succinate (TOPROL-XL) 100 MG 24 hr tablet Take 100 mg by mouth 2 (two) times daily. Take with or immediately following a meal.  . pravastatin (PRAVACHOL) 20 MG tablet Take 20 mg by mouth at bedtime.   . predniSONE (DELTASONE) 10 MG tablet Take 6 tablets (60 mg total) by mouth daily. And decrease by 1 tablet daily  . tiotropium (SPIRIVA) 18 MCG inhalation capsule Place 18 mcg into inhaler and inhale daily.  Alveda Reasons 20 MG TABS tablet Take 20  mg by mouth every morning.    No facility-administered encounter medications on file as of 03/17/2017.     ALLERGIES: No Known Allergies  VACCINATION STATUS:  There is no immunization history on file for this patient.   HPI  John Mcgrath is 71 y.o. male who presents today with a medical history as above. he is being seen in consultation for hyperthyroidism requested by Renee Rival, NP. He was recently hospitalized in [facility] where he was found to have tachyarrhythmia as well as CHF. Workup showed suppressed TSH on 2 occasions along with elevated free T4 and normal free T3. He was treated with high-dose steroids, and discharged on prednisone tapering currently on 10 mg by mouth daily.  he has been dealing with symptoms of  tremors, palpitations, anxiety for several weeks. - He denies heat intolerance, weight loss. -  He has smoking-related advanced COPD on 5 L of oxygen per nasal cannula. He does not tolerate any exertion. he denies dysphagia, choking. He reports shortness of breath, no recent voice change. - CT scan of chest which was done for workup of shortness of breath revealed 2.8 cm stable left thyroid  nodule.   he has family history of thyroid dysfunction in his mother who had to undergo thyroidectomy for unidentified pathology, but he says he was not cancer.  he denies personal history of goiter. he is not on any anti-thyroid medications nor on any thyroid hormone supplements. he  is willing to proceed with appropriate work up and therapy for thyrotoxicosis.  Constitutional: + weight loss, + fatigue, + subjective hyperthermia Eyes: no blurry vision, - xerophthalmia ENT: no sore throat, no nodules palpated in throat, no dysphagia/odynophagia, nor hoarseness Cardiovascular: no Chest Pain, no Shortness of Breath, ++palpitations, no leg swelling Respiratory: no cough, no SOB Gastrointestinal: no Nausea, no Vomiting, no Diarhhea Musculoskeletal: no muscle/joint  aches Skin: no rashes Neurological: ++  tremors, no numbness, no tingling, no dizziness Psychiatric: no depression, ++  anxiety   Objective:    BP 134/84   Pulse 100   Ht 5\' 9"  (1.753 m)   Wt 216 lb (98 kg)   SpO2 (!) 88% Comment: 5L O2  BMI 31.90 kg/m   Wt Readings from Last 3 Encounters:  03/17/17 216 lb (98 kg)  03/15/17 214 lb 15.5 oz (97.5 kg)  11/09/13 216 lb (98 kg)     Constitutional: + obese for height,  + discomfort using oxygen per nasal cannula.  Eyes: PERRLA, EOMI, - exophthalmos ENT: moist mucous membranes, +  thyromegaly, no cervical lymphadenopathy Cardiovascular: + stable precordial activity, + controlled Rate and Rhythm, no Murmur/Rubs/Gallops Respiratory:  adequate breathing efforts, no gross chest deformity, Clear to auscultation bilaterally Gastrointestinal: abdomen soft, Non -tender, No distension, Bowel Sounds present Musculoskeletal: no gross deformities, strength intact in all four extremities Skin: moist, warm, no rashes Neurological: ++ tremor with outstretched hands,  + Deep Tendon Reflexes  on both lower extremities.   CMP     Component Value Date/Time   NA 129 (L) 03/15/2017 0423   K 4.6 03/15/2017 0423   CL 92 (L) 03/15/2017 0423   CO2 27 03/15/2017 0423   GLUCOSE 306 (H) 03/15/2017 0423   BUN 31 (H) 03/15/2017 0423   CREATININE 1.55 (H) 03/15/2017 0423   CALCIUM 8.7 (L) 03/15/2017 0423   PROT 6.6 03/14/2017 0329   ALBUMIN 3.8 03/14/2017 0329   AST 18 03/14/2017 0329   ALT 16 (L) 03/14/2017 0329   ALKPHOS 74 03/14/2017 0329   BILITOT 0.9 03/14/2017 0329   GFRNONAA 44 (L) 03/15/2017 0423   GFRAA 51 (L) 03/15/2017 0423     CBC    Component Value Date/Time   WBC 8.4 03/14/2017 0015   RBC 5.10 03/14/2017 0015   HGB 16.8 03/14/2017 0015   HCT 49.2 03/14/2017 0015   PLT 126 (L) 03/14/2017 0015   MCV 96.5 03/14/2017 0015   MCH 32.9 03/14/2017 0015   MCHC 34.1 03/14/2017 0015   RDW 14.6 03/14/2017 0015   LYMPHSABS 2.7  03/14/2017 0015   MONOABS 0.8 03/14/2017 0015   EOSABS 0.2 03/14/2017 0015   BASOSABS 0.0 03/14/2017 0015   Results for JIMY, GATES (MRN [id number]) as of 03/17/2017 15:22  Ref. Range 03/14/2017 00:13 03/14/2017 11:26 03/15/2017 13:34 03/15/2017 13:38  TSH Latest Ref Range: 0.350 - 4.500 uIU/mL 0.025 (L) <0.010 (L)    Triiodothyronine,Free,Serum Latest Ref Range: 2.0 - 4.4 pg/mL    3.0  T4,Free(Direct) Latest Ref Range: 0.61 - 1.12 ng/dL   1.23 (H)     Assessment & Plan:   1. Hyperthyroidism  he is being seen at a kind request of Renee Rival, NP.  his history and most recent labs are reviewed, and he was examined clinically. Subjective and objective findings are consistent with thyrotoxicosis likely from primary hyperthyroidism. The potential risks of untreated thyrotoxicosis and the need for definitive therapy have been discussed in detail with him, and he agrees to proceed with diagnostic workup and treatment plan.    One rare possibility is sick euthyroid syndrome. I like to obtain confirmatory thyroid uptake and scan will be scheduled to be done as soon as possible.   Options of therapy are discussed with him. Given his cardiovascular comorbidities, he may need definitive antithyroid therapy. -  Definitive therapy may involve RAI ablation of the thyroid, with subsequent need for lifelong thyroid hormone replacement. he is made aware of this outcome  and he is  willing to proceed. he will return in 1 week for treatment decision.  He is already on prednisone and metoprolol 100 mg by mouth daily, did not initiate any new prescriptions today. - His extensively counseled against smoking. - I advised him to maintain close follow up with Renee Rival, NP for primary care needs.  Follow up plan: Return in about 1 week (around 03/24/2017) for follow up with thyroid uptake and scan.   Thank you for involving me in the care of this patient, and I will continue to update you  with his progress.  Glade Lloyd, MD [facility] Phone: [phone]  Fax: [phone]   03/17/2017, 4:05 PM  This note was partially dictated with voice recognition software. Similar sounding words can be transcribed inadequately or may not  be corrected upon review.

## 2017-03-19 LAB — CULTURE, BLOOD (ROUTINE X 2)
Culture: NO GROWTH
Culture: NO GROWTH
SPECIAL REQUESTS: ADEQUATE
Special Requests: ADEQUATE

## 2017-04-06 ENCOUNTER — Ambulatory Visit (HOSPITAL_COMMUNITY): Payer: Medicare Other

## 2017-04-25 ENCOUNTER — Encounter (HOSPITAL_COMMUNITY): Payer: Self-pay

## 2017-04-25 ENCOUNTER — Encounter (HOSPITAL_COMMUNITY)
Admission: RE | Admit: 2017-04-25 | Discharge: 2017-04-25 | Disposition: A | Discharge status: Home / Self Care | Payer: Medicare Other | Source: Ambulatory Visit | Attending: "Endocrinology | Admitting: "Endocrinology

## 2017-04-25 DIAGNOSIS — E0590 Thyrotoxicosis, unspecified without thyrotoxic crisis or storm: Secondary | ICD-10-CM | POA: Insufficient documentation

## 2017-04-25 MED ORDER — SODIUM IODIDE I-123 7.4 MBQ CAPS
400.0000 | ORAL_CAPSULE | Freq: Once | ORAL | Status: AC
  Administered 2017-04-25: 364 via ORAL

## 2017-04-26 ENCOUNTER — Encounter (HOSPITAL_COMMUNITY)
Admission: RE | Admit: 2017-04-26 | Discharge: 2017-04-26 | Disposition: A | Discharge status: Home / Self Care | Payer: Medicare Other | Source: Ambulatory Visit | Attending: "Endocrinology | Admitting: "Endocrinology

## 2017-05-02 ENCOUNTER — Encounter: Payer: Self-pay | Admitting: "Endocrinology

## 2017-05-02 ENCOUNTER — Ambulatory Visit: Payer: Medicare Other | Admitting: "Endocrinology

## 2017-05-02 VITALS — BP 123/78 | HR 76 | Ht 69.0 in | Wt 209.0 lb

## 2017-05-02 DIAGNOSIS — E039 Hypothyroidism, unspecified: Secondary | ICD-10-CM | POA: Diagnosis not present

## 2017-05-02 DIAGNOSIS — E041 Nontoxic single thyroid nodule: Secondary | ICD-10-CM

## 2017-05-02 DIAGNOSIS — E038 Other specified hypothyroidism: Secondary | ICD-10-CM | POA: Insufficient documentation

## 2017-05-02 NOTE — Progress Notes (Signed)
Subjective:    Patient ID: John Mcgrath, male    DOB: 01-Sep-1945, PCP Renee Rival, NP.   Past Medical History:  Diagnosis Date  . Asthma   . Borderline diabetic   . Cataract    starting to form per pt per eye md  . COPD (chronic obstructive pulmonary disease) ([place])   . Gout    pt denies  . High cholesterol   . Hypertension   . Leukocytosis   . Polycythemia   . TIA (transient ischemic attack) 20 y ago   Past Surgical History:  Procedure Laterality Date  . HERNIA REPAIR  @ 71 years of age & then again @ 71 years of age   Social History   Socioeconomic History  . Marital status: Married    Spouse name: None  . Number of children: None  . Years of education: None  . Highest education level: None  Social Needs  . Financial resource strain: None  . Food insecurity - worry: None  . Food insecurity - inability: None  . Transportation needs - medical: None  . Transportation needs - non-medical: None  Occupational History  . None  Tobacco Use  . Smoking status: Current Every Day Smoker    Packs/day: 1.00    Years: 50.00    Pack years: 50.00  . Smokeless tobacco: Never Used  Substance and Sexual Activity  . Alcohol use: Yes    Alcohol/week: 12.6 oz    Types: 21 Cans of beer per week  . Drug use: No  . Sexual activity: None  Other Topics Concern  . None  Social History Narrative  . None   Outpatient Encounter Medications as of 05/02/2017  Medication Sig  . allopurinol (ZYLOPRIM) 100 MG tablet Take 200 mg by mouth daily.   Marland Kitchen azithromycin (ZITHROMAX) 500 MG tablet Take 1 tablet (500 mg total) by mouth daily.  . fexofenadine (ALLEGRA) 180 MG tablet Take 180 mg by mouth daily.  . furosemide (LASIX) 20 MG tablet Take 20 mg by mouth daily.   Marland Kitchen ipratropium-albuterol (DUONEB) 0.5-2.5 (3) MG/3ML SOLN Inhale 3 mLs into the lungs daily.   . metoprolol succinate (TOPROL-XL) 100 MG 24 hr tablet Take 100 mg by mouth 2 (two) times daily. Take with or immediately  following a meal.  . pravastatin (PRAVACHOL) 20 MG tablet Take 20 mg by mouth at bedtime.   . predniSONE (DELTASONE) 10 MG tablet Take 6 tablets (60 mg total) by mouth daily. And decrease by 1 tablet daily  . tiotropium (SPIRIVA) 18 MCG inhalation capsule Place 18 mcg into inhaler and inhale daily.  Alveda Reasons 20 MG TABS tablet Take 20 mg by mouth every morning.    No facility-administered encounter medications on file as of 05/02/2017.     ALLERGIES: No Known Allergies  VACCINATION STATUS:  There is no immunization history on file for this patient.   HPI  John Mcgrath is 71 y.o. male who presents today for a follow up after being seen in consultation for hyperthyroidism requested by Renee Rival, NP. He was recently hospitalized in [facility] where he was found to have tachyarrhythmia as well as CHF. Workup showed suppressed TSH on 2 occasions along with elevated free T4 and normal free T3. He was treated with high-dose steroids, and discharged on prednisone tapering currently on 10 mg by mouth daily.  he has been dealing with symptoms of  tremors, palpitations, anxiety for several weeks. - His  thyroid uptake and scan done last week was unremarkable for hyperthyroidism at 18%, however showed left-sided cold area which needs further study. A previous chest CT incidentally showed 2.8 cm left thyroid lobe nodule. - He denies heat intolerance, weight loss. -  He has smoking-related advanced COPD on 5 L of oxygen per nasal cannula. He does not tolerate any exertion. he denies dysphagia, choking. He reports shortness of breath, no recent voice change.   he has family history of thyroid dysfunction in his mother who had to undergo thyroidectomy for unidentified pathology, but he says he was not cancer.  he denies personal history of goiter. he is not on any anti-thyroid medications nor on any thyroid hormone supplements. he  is willing to proceed with appropriate work up and  therapy for thyrotoxicosis.  Constitutional: + weight loss, + fatigue, - subjective hyperthermia Eyes: no blurry vision, - xerophthalmia ENT: no sore throat, no nodules palpated in throat, no dysphagia/odynophagia, nor hoarseness Cardiovascular: no Chest Pain, + Shortness of Breath, - palpitations, no leg swelling Respiratory: + cough, + SOB Gastrointestinal: no Nausea, no Vomiting, no Diarhhea Musculoskeletal: no muscle/joint aches Skin: no rashes Neurological: ++  tremors, no numbness, no tingling, no dizziness Psychiatric: no depression, ++  anxiety   Objective:    BP 123/78   Pulse 76   Ht 5\' 9"  (1.753 m)   Wt 209 lb (94.8 kg)   BMI 30.86 kg/m   Wt Readings from Last 3 Encounters:  05/02/17 209 lb (94.8 kg)  03/17/17 216 lb (98 kg)  03/15/17 214 lb 15.5 oz (97.5 kg)     Constitutional: + obese for height,   thin acute distress. Eyes: PERRLA, EOMI, - exophthalmos ENT: moist mucous membranes, +  thyromegaly, no cervical lymphadenopathy Cardiovascular: + stable precordial activity, + controlled Rate and Rhythm, no Murmur/Rubs/Gallops Respiratory:  adequate breathing efforts, no gross chest deformity, Clear to auscultation bilaterally Gastrointestinal: abdomen soft, Non -tender, No distension, Bowel Sounds present Musculoskeletal: no gross deformities, strength intact in all four extremities Skin: moist, warm, no rashes Neurological: ++ tremor with outstretched hands,  + Deep Tendon Reflexes  on both lower extremities.   CMP     Component Value Date/Time   NA 129 (L) 03/15/2017 0423   K 4.6 03/15/2017 0423   CL 92 (L) 03/15/2017 0423   CO2 27 03/15/2017 0423   GLUCOSE 306 (H) 03/15/2017 0423   BUN 31 (H) 03/15/2017 0423   CREATININE 1.55 (H) 03/15/2017 0423   CALCIUM 8.7 (L) 03/15/2017 0423   PROT 6.6 03/14/2017 0329   ALBUMIN 3.8 03/14/2017 0329   AST 18 03/14/2017 0329   ALT 16 (L) 03/14/2017 0329   ALKPHOS 74 03/14/2017 0329   BILITOT 0.9 03/14/2017 0329    GFRNONAA 44 (L) 03/15/2017 0423   GFRAA 51 (L) 03/15/2017 0423     CBC    Component Value Date/Time   WBC 8.4 03/14/2017 0015   RBC 5.10 03/14/2017 0015   HGB 16.8 03/14/2017 0015   HCT 49.2 03/14/2017 0015   PLT 126 (L) 03/14/2017 0015   MCV 96.5 03/14/2017 0015   MCH 32.9 03/14/2017 0015   MCHC 34.1 03/14/2017 0015   RDW 14.6 03/14/2017 0015   LYMPHSABS 2.7 03/14/2017 0015   MONOABS 0.8 03/14/2017 0015   EOSABS 0.2 03/14/2017 0015   BASOSABS 0.0 03/14/2017 0015   Results for MARCELL, CHAVARIN (MRN [id number]) as of 03/17/2017 15:22  Ref. Range 03/14/2017 00:13 03/14/2017 11:26 03/15/2017 13:34 03/15/2017 13:38  TSH  Latest Ref Range: 0.350 - 4.500 uIU/mL 0.025 (L) <0.010 (L)    Triiodothyronine,Free,Serum Latest Ref Range: 2.0 - 4.4 pg/mL    3.0  T4,Free(Direct) Latest Ref Range: 0.61 - 1.12 ng/dL   1.23 (H)    Thyroid uptake and scan from 04/26/2017 showed uptake of 18% with possible cord area on the left lobe.   Assessment & Plan:   1. Subclinical Hyperthyroidism - His thyroid uptake and scan is not confirmatory for primary hyper thyroidism. - Clinically he continues to improve, likely indicating transient thyrotoxicosis from subacute thyroiditis. - He would not require ablative treatment at this time. - He will have a repeat thyroid function tests in 3 months. - Regarding cold area on the left lower thyroid, known 2.8 cm left lobe nodule on previous CT scan, thyroid/neck sonogram would be a better imaging study. - This study would be arranged to be done as soon as possible.   - His extensively counseled against smoking. - I advised him to maintain close follow up with Renee Rival, NP for primary care needs.  Follow up plan: Return in about 3 months (around 07/31/2017) for follow up with pre-visit labs, Thyroid / Neck Ultrasound.   Thank you for involving me in the care of this patient, and I will continue to update you with his progress.  Glade Lloyd,  MD [facility] [place] [facility] Phone: [phone]  Fax: [phone]   05/02/2017, 12:56 PM  This note was partially dictated with voice recognition software. Similar sounding words can be transcribed inadequately or may not  be corrected upon review.

## 2017-05-04 ENCOUNTER — Ambulatory Visit (HOSPITAL_COMMUNITY): Payer: Medicare Other

## 2017-06-08 ENCOUNTER — Inpatient Hospital Stay (HOSPITAL_COMMUNITY): Payer: Medicare Other

## 2017-06-08 ENCOUNTER — Other Ambulatory Visit: Payer: Self-pay

## 2017-06-08 ENCOUNTER — Encounter (HOSPITAL_COMMUNITY): Payer: Self-pay | Admitting: Hematology and Oncology

## 2017-06-08 ENCOUNTER — Inpatient Hospital Stay (HOSPITAL_COMMUNITY): Payer: Medicare Other | Attending: Hematology and Oncology | Admitting: Hematology and Oncology

## 2017-06-08 VITALS — BP 110/55 | HR 81 | Temp 97.7°F | Resp 20 | Ht 69.0 in | Wt 214.0 lb

## 2017-06-08 DIAGNOSIS — D479 Neoplasm of uncertain behavior of lymphoid, hematopoietic and related tissue, unspecified: Secondary | ICD-10-CM

## 2017-06-08 DIAGNOSIS — Z9981 Dependence on supplemental oxygen: Secondary | ICD-10-CM | POA: Diagnosis not present

## 2017-06-08 DIAGNOSIS — G4733 Obstructive sleep apnea (adult) (pediatric): Secondary | ICD-10-CM | POA: Diagnosis not present

## 2017-06-08 DIAGNOSIS — D751 Secondary polycythemia: Secondary | ICD-10-CM | POA: Diagnosis present

## 2017-06-08 DIAGNOSIS — I129 Hypertensive chronic kidney disease with stage 1 through stage 4 chronic kidney disease, or unspecified chronic kidney disease: Secondary | ICD-10-CM | POA: Insufficient documentation

## 2017-06-08 DIAGNOSIS — Z8673 Personal history of transient ischemic attack (TIA), and cerebral infarction without residual deficits: Secondary | ICD-10-CM | POA: Insufficient documentation

## 2017-06-08 DIAGNOSIS — I4891 Unspecified atrial fibrillation: Secondary | ICD-10-CM | POA: Diagnosis not present

## 2017-06-08 DIAGNOSIS — E7800 Pure hypercholesterolemia, unspecified: Secondary | ICD-10-CM | POA: Insufficient documentation

## 2017-06-08 DIAGNOSIS — Z7901 Long term (current) use of anticoagulants: Secondary | ICD-10-CM | POA: Insufficient documentation

## 2017-06-08 DIAGNOSIS — F17210 Nicotine dependence, cigarettes, uncomplicated: Secondary | ICD-10-CM | POA: Diagnosis not present

## 2017-06-08 DIAGNOSIS — D693 Immune thrombocytopenic purpura: Secondary | ICD-10-CM | POA: Diagnosis not present

## 2017-06-08 DIAGNOSIS — J449 Chronic obstructive pulmonary disease, unspecified: Secondary | ICD-10-CM | POA: Diagnosis not present

## 2017-06-08 DIAGNOSIS — Z79899 Other long term (current) drug therapy: Secondary | ICD-10-CM

## 2017-06-08 DIAGNOSIS — N183 Chronic kidney disease, stage 3 (moderate): Secondary | ICD-10-CM | POA: Diagnosis not present

## 2017-06-08 DIAGNOSIS — M109 Gout, unspecified: Secondary | ICD-10-CM | POA: Insufficient documentation

## 2017-06-08 LAB — COMPREHENSIVE METABOLIC PANEL
ALK PHOS: 65 U/L (ref 38–126)
ALT: 16 U/L — ABNORMAL LOW (ref 17–63)
ANION GAP: 9 (ref 5–15)
AST: 19 U/L (ref 15–41)
Albumin: 3.6 g/dL (ref 3.5–5.0)
BILIRUBIN TOTAL: 0.5 mg/dL (ref 0.3–1.2)
BUN: 14 mg/dL (ref 6–20)
CALCIUM: 8.8 mg/dL — AB (ref 8.9–10.3)
CO2: 28 mmol/L (ref 22–32)
Chloride: 98 mmol/L — ABNORMAL LOW (ref 101–111)
Creatinine, Ser: 1.21 mg/dL (ref 0.61–1.24)
GFR calc non Af Amer: 59 mL/min — ABNORMAL LOW (ref >60)
Glucose, Bld: 140 mg/dL — ABNORMAL HIGH (ref 65–99)
POTASSIUM: 4.1 mmol/L (ref 3.5–5.1)
Sodium: 135 mmol/L (ref 135–145)
TOTAL PROTEIN: 6.2 g/dL — AB (ref 6.5–8.1)

## 2017-06-08 LAB — CBC WITH DIFFERENTIAL/PLATELET
Basophils Absolute: 0 10*3/uL (ref 0.0–0.1)
Basophils Relative: 0 %
Eosinophils Absolute: 0.1 10*3/uL (ref 0.0–0.7)
Eosinophils Relative: 2 %
HEMATOCRIT: 47.5 % (ref 39.0–52.0)
HEMOGLOBIN: 15.9 g/dL (ref 13.0–17.0)
LYMPHS ABS: 3.4 10*3/uL (ref 0.7–4.0)
Lymphocytes Relative: 37 %
MCH: 31.6 pg (ref 26.0–34.0)
MCHC: 33.5 g/dL (ref 30.0–36.0)
MCV: 94.4 fL (ref 78.0–100.0)
MONO ABS: 0.7 10*3/uL (ref 0.1–1.0)
MONOS PCT: 8 %
NEUTROS ABS: 5 10*3/uL (ref 1.7–7.7)
NEUTROS PCT: 53 %
Platelets: 125 10*3/uL — ABNORMAL LOW (ref 150–400)
RBC: 5.03 MIL/uL (ref 4.22–5.81)
RDW: 14.4 % (ref 11.5–15.5)
WBC: 9.3 10*3/uL (ref 4.0–10.5)

## 2017-06-08 LAB — LACTATE DEHYDROGENASE: LDH: 137 U/L (ref 98–192)

## 2017-06-08 LAB — URIC ACID: URIC ACID, SERUM: 5.8 mg/dL (ref 4.4–7.6)

## 2017-06-09 LAB — BETA 2 MICROGLOBULIN, SERUM: Beta-2 Microglobulin: 2.6 mg/L — ABNORMAL HIGH (ref 0.6–2.4)

## 2017-06-17 ENCOUNTER — Ambulatory Visit (HOSPITAL_COMMUNITY): Payer: Medicare Other

## 2017-06-19 DIAGNOSIS — D479 Neoplasm of uncertain behavior of lymphoid, hematopoietic and related tissue, unspecified: Secondary | ICD-10-CM | POA: Insufficient documentation

## 2017-06-19 NOTE — Progress Notes (Signed)
John Mcgrath New Visit:  Assessment: Lymphoproliferative disorder [facility]) 72 y.o. male with previous history of lymphoproliferative disorder initially detected in 2015, but incompletely characterized at that time due to patient being lost to follow-up.  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   Currently, patient returns to the clinic.  He remains asymptomatic.  No palpable lymphadenopathy on examination, no B symptoms.  White blood cell count including lymphocyte count have improved compared to the last visit.  Additionally, previous secondary polycythemia has improved significantly with oxygen supplementation and treatment of COPD.  Platelet count remains mildly decreased.  Whether the patient has ITP or not, is irrelevant as no therapy is indicated at this time.   plan: -- Repeat labs today including peripheral blood flow cytometry -- CT of the abdomen/pelvis to assess for possible lymphadenopathy or splenomegaly -- Return to clinic in 1 week to discuss the findings  Voice recognition software was used and creation of this note. Despite my best effort at editing the text, some misspelling/errors may have occurred.  Orders Placed This Encounter  Procedures  . CT Abdomen Pelvis W Contrast    Standing Status:   Future    Standing Expiration Date:   06/08/2018    Order Specific Question:   If indicated for the ordered procedure, I authorize the administration of contrast media per Radiology protocol    Answer:   Yes    Order Specific Question:   Preferred imaging location?    Answer:   [facility]    Order Specific Question:   Radiology Contrast Protocol - do NOT remove file path    Answer:   [email or web address]    Order Specific Question:   Reason for Exam additional  comments    Answer:   Lymphoproliferative process, please eval for lymphadenopathy/splenomegaly  . CBC with Differential    Standing Status:   Future    Number of Occurrences:   1    Standing Expiration Date:   06/08/2018  . Comprehensive metabolic panel    Standing Status:   Future    Number of Occurrences:   1    Standing Expiration Date:   06/08/2018  . Lactate dehydrogenase    Standing Status:   Future    Number of Occurrences:   1    Standing Expiration Date:   06/08/2018  . Beta 2 microglobuline, serum    Standing Status:   Future    Number of Occurrences:   1    Standing Expiration Date:   06/08/2018  . Uric acid    Standing Status:   Future    Number of Occurrences:   1    Standing Expiration Date:   06/08/2018  . Zap-70    All questions were answered.  . The patient knows to call the clinic with any problems, questions or concerns.  This note was electronically signed.    History of Presenting Illness John Mcgrath 72 y.o. presenting to the [place] for diagnosis of lymphoproliferative disorder.  Patient was previously seen and diagnosed in 2015.  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  It appears that he was lost to follow-up and now has been referred back to [place] for additional assessment and reestablishment of care.  Please see hematological history below for details.  He has past medical history significant for COPD, oxygen dependent at this time, stage III chronic kidney disease, atrial fibrillation on rivaroxaban, hypertension, hyperlipidemia.  Patient reports feeling well with excellent appetite, no fevers, chills, night sweats.  No unexpected weight loss or weight gain.  He does have mild fatigue, but is able to conduct activities of daily living without significant impairment.  Denies any swelling in the neck, armpits, or groin.  No nausea, early satiety, abdominal pain, diarrhea, or  constipation.  Oncological/hematological History: --Labs, 08/28/13: WBC 10.7, ANC 4.7, ALC 5.0, Mono 0.8, Eos 0.2, Baso 0.0, Hgb 17.9, Hct 52.8, MCV 95.1, MCH 32.3, RDW 13.2, Plt 147; JAK2 & BCR/ABL -- negative; --pBlood FlowCyto, 08/30/13: Positive for monoclonal lymphocyte population, 5% of the ALC; Flow positive for CD19, CD20, CD 21, CD22, CD23, HLA-DR, kappa light chain, CD25, FMC7 & negative for CD5, CD10, ZAP-70 --Labs, 03/14/17: WBC   8.4, ANC 4.7, ALC 2.7, Mono 0.8, Eos 0.2, Baso 0.0, Hgb 16.8, Hct     ..., MCV 96.5, MCH 32.9, RDW 14.6, Plt 126;    Medical History: Past Medical History:  Diagnosis Date  . Asthma   . Borderline diabetic   . Cataract    starting to form per pt per eye md  . COPD (chronic obstructive pulmonary disease) ([place])   . Gout    pt denies  . High cholesterol   . Hypertension   . Leukocytosis   . Polycythemia   . TIA (transient ischemic attack) 20 y ago    Surgical History: Past Surgical History:  Procedure Laterality Date  . HERNIA REPAIR  @ 72 years of age & then again @ 72 years of age    Family History: Family History  Problem Relation Age of Onset  . Obesity Daughter     Social History: Social History   Socioeconomic History  . Marital status: Married    Spouse name: Not on file  . Number of children: Not on file  . Years of education: Not on file  . Highest education level: Not on file  Social Needs  . Financial resource strain: Not on file  . Food insecurity - worry: Not on file  . Food insecurity - inability: Not on file  . Transportation needs - medical: Not on file  . Transportation needs - non-medical: Not on file  Occupational History  . Not on file  Tobacco Use  . Smoking status: Current Every Day Smoker    Packs/day: 1.00    Years: 50.00    Pack years: 50.00  . Smokeless tobacco: Never Used  Substance and Sexual Activity  . Alcohol use: Yes    Alcohol/week: 12.6 oz    Types: 21 Cans of beer per week  . Drug  use: No  . Sexual activity: Not on file  Other Topics Concern  . Not on file  Social History Narrative  . Not on file    Allergies: Allergies  Allergen Reactions  . Statins Itching    Medications:  Current Outpatient Medications  Medication Sig Dispense Refill  . allopurinol (ZYLOPRIM) 100 MG tablet Take 200 mg by mouth daily.     . fexofenadine (ALLEGRA) 180 MG tablet Take 180 mg by mouth daily.  0  . furosemide (LASIX) 20 MG tablet Take 20 mg by mouth daily.     Marland Kitchen ipratropium-albuterol (DUONEB) 0.5-2.5 (3) MG/3ML SOLN Inhale 3 mLs into the lungs daily.     . metoprolol succinate (TOPROL-XL) 100 MG 24 hr  tablet Take 100 mg by mouth 2 (two) times daily. Take with or immediately following a meal.    . tiotropium (SPIRIVA) 18 MCG inhalation capsule Place 18 mcg into inhaler and inhale daily.    Alveda Reasons 20 MG TABS tablet Take 20 mg by mouth every morning.      No current facility-administered medications for this visit.     Review of Systems: Review of Systems  All other systems reviewed and are negative.    PHYSICAL EXAMINATION Blood pressure (!) 110/55, pulse 81, temperature 97.7 F (36.5 C), temperature source Oral, resp. rate 20, height '5\' 9"'$  (1.753 m), weight 214 lb (97.1 kg), SpO2 90 %.  ECOG PERFORMANCE STATUS: 0 - Asymptomatic  Physical Exam  Constitutional: He is oriented to person, place, and time and well-developed, well-nourished, and in no distress. No distress.  HENT:  Head: Normocephalic and atraumatic.  Mouth/Throat: Oropharynx is clear and moist. No oropharyngeal exudate.  Eyes: Conjunctivae and EOM are normal. Pupils are equal, round, and reactive to light. No scleral icterus.  Neck: No thyromegaly present.  Cardiovascular: Normal heart sounds.  No murmur heard. Irregularly irregular  Pulmonary/Chest: Effort normal and breath sounds normal. No respiratory distress. He has no wheezes. He has no rales.  Abdominal: Soft. Bowel sounds are normal. He  exhibits no distension. There is no tenderness. There is no rebound.  Musculoskeletal: He exhibits no edema.  Lymphadenopathy:       Head (right side): No submandibular and no occipital adenopathy present.       Head (left side): No submandibular and no occipital adenopathy present.    He has no cervical adenopathy.    He has no axillary adenopathy.       Right: No inguinal and no supraclavicular adenopathy present.       Left: No inguinal and no supraclavicular adenopathy present.  Neurological: He is alert and oriented to person, place, and time. He has normal reflexes. No cranial nerve deficit.  Skin: Skin is warm and dry. No rash noted. He is not diaphoretic. No erythema.     LABORATORY DATA: I have personally reviewed the data as listed: Appointment on 06/08/2017  Component Date Value Ref Range Status  . WBC 06/08/2017 9.3  4.0 - 10.5 K/uL Final  . RBC 06/08/2017 5.03  4.22 - 5.81 MIL/uL Final  . Hemoglobin 06/08/2017 15.9  13.0 - 17.0 g/dL Final  . HCT 06/08/2017 47.5  39.0 - 52.0 % Final  . MCV 06/08/2017 94.4  78.0 - 100.0 fL Final  . MCH 06/08/2017 31.6  26.0 - 34.0 pg Final  . MCHC 06/08/2017 33.5  30.0 - 36.0 g/dL Final  . RDW 06/08/2017 14.4  11.5 - 15.5 % Final  . Platelets 06/08/2017 125* 150 - 400 K/uL Final  . Neutrophils Relative % 06/08/2017 53  % Final  . Neutro Abs 06/08/2017 5.0  1.7 - 7.7 K/uL Final  . Lymphocytes Relative 06/08/2017 37  % Final  . Lymphs Abs 06/08/2017 3.4  0.7 - 4.0 K/uL Final  . Monocytes Relative 06/08/2017 8  % Final  . Monocytes Absolute 06/08/2017 0.7  0.1 - 1.0 K/uL Final  . Eosinophils Relative 06/08/2017 2  % Final  . Eosinophils Absolute 06/08/2017 0.1  0.0 - 0.7 K/uL Final  . Basophils Relative 06/08/2017 0  % Final  . Basophils Absolute 06/08/2017 0.0  0.0 - 0.1 K/uL Final  . Sodium 06/08/2017 135  135 - 145 mmol/L Final  . Potassium 06/08/2017 4.1  3.5 - 5.1 mmol/L Final  . Chloride 06/08/2017 98* 101 - 111 mmol/L Final  .  CO2 06/08/2017 28  22 - 32 mmol/L Final  . Glucose, Bld 06/08/2017 140* 65 - 99 mg/dL Final  . BUN 06/08/2017 14  6 - 20 mg/dL Final  . Creatinine, Ser 06/08/2017 1.21  0.61 - 1.24 mg/dL Final  . Calcium 06/08/2017 8.8* 8.9 - 10.3 mg/dL Final  . Total Protein 06/08/2017 6.2* 6.5 - 8.1 g/dL Final  . Albumin 06/08/2017 3.6  3.5 - 5.0 g/dL Final  . AST 06/08/2017 19  15 - 41 U/L Final  . ALT 06/08/2017 16* 17 - 63 U/L Final  . Alkaline Phosphatase 06/08/2017 65  38 - 126 U/L Final  . Total Bilirubin 06/08/2017 0.5  0.3 - 1.2 mg/dL Final  . GFR calc non Af Amer 06/08/2017 59* >60 mL/min Final  . GFR calc Af Amer 06/08/2017 >60  >60 mL/min Final   Comment: (NOTE) The eGFR has been calculated using the CKD EPI equation. This calculation has not been validated in all clinical situations. eGFR's persistently <60 mL/min signify possible Chronic Kidney Disease.   . Anion gap 06/08/2017 9  5 - 15 Final  . LDH 06/08/2017 137  98 - 192 U/L Final  . Beta-2 Microglobulin 06/08/2017 2.6* 0.6 - 2.4 mg/L Final   Comment: (NOTE) Siemens Immulite 2000 Immunochemiluminometric assay (ICMA) Values obtained with different assay methods or kits cannot be used interchangeably. Results cannot be interpreted as absolute evidence of the presence or absence of malignant disease. Performed At: [facility] [place], [place] [id number] Rush Farmer MD QQ:[id number]   . Uric Acid, Serum 06/08/2017 5.8  4.4 - 7.6 mg/dL Final  Office Visit on 06/08/2017  Component Date Value Ref Range Status  . ZAP-70 06/08/2017 PENDING   Incomplete  . Specimen Status 06/08/2017 Comment   Final   Comment: (NOTE) Reference lab report sent via fax. Performed At: ;# [facility] [address] [place], [place] [id number] Ebony Hail MD YL:[id number]   . Interpretation: 06/08/2017 PENDING   Incomplete         Ardath Sax, MD

## 2017-06-19 NOTE — Assessment & Plan Note (Signed)
72 y.o. male with previous history of lymphoproliferative disorder initially detected in 2015, but incompletely characterized at that time due to patient being lost to follow-up.  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   Currently, patient returns to the clinic.  He remains asymptomatic.  No palpable lymphadenopathy on examination, no B symptoms.  White blood cell count including lymphocyte count have improved compared to the last visit.  Additionally, previous secondary polycythemia has improved significantly with oxygen supplementation and treatment of COPD.  Platelet count remains mildly decreased.  Whether the patient has ITP or not, is irrelevant as no therapy is indicated at this time.   plan: -- Repeat labs today including peripheral blood flow cytometry -- CT of the abdomen/pelvis to assess for possible lymphadenopathy or splenomegaly -- Return to clinic in 1 week to discuss the findings

## 2017-06-20 ENCOUNTER — Ambulatory Visit (HOSPITAL_COMMUNITY): Payer: Medicare Other | Admitting: Oncology

## 2017-06-29 ENCOUNTER — Ambulatory Visit (HOSPITAL_COMMUNITY)
Admission: RE | Admit: 2017-06-29 | Discharge: 2017-06-29 | Disposition: A | Discharge status: Home / Self Care | Payer: Medicare Other | Source: Ambulatory Visit | Attending: Hematology and Oncology | Admitting: Hematology and Oncology

## 2017-06-29 DIAGNOSIS — I700 Atherosclerosis of aorta: Secondary | ICD-10-CM | POA: Insufficient documentation

## 2017-06-29 DIAGNOSIS — D479 Neoplasm of uncertain behavior of lymphoid, hematopoietic and related tissue, unspecified: Secondary | ICD-10-CM | POA: Insufficient documentation

## 2017-06-29 DIAGNOSIS — J90 Pleural effusion, not elsewhere classified: Secondary | ICD-10-CM | POA: Insufficient documentation

## 2017-06-29 DIAGNOSIS — R918 Other nonspecific abnormal finding of lung field: Secondary | ICD-10-CM | POA: Insufficient documentation

## 2017-06-29 DIAGNOSIS — N2889 Other specified disorders of kidney and ureter: Secondary | ICD-10-CM | POA: Diagnosis not present

## 2017-06-29 LAB — ZAP-70

## 2017-06-29 MED ORDER — IOPAMIDOL (ISOVUE-300) INJECTION 61%
100.0000 mL | Freq: Once | INTRAVENOUS | Status: AC | PRN
  Administered 2017-06-29: 100 mL via INTRAVENOUS

## 2017-06-30 ENCOUNTER — Other Ambulatory Visit: Payer: Self-pay

## 2017-06-30 ENCOUNTER — Inpatient Hospital Stay (HOSPITAL_COMMUNITY): Payer: Medicare Other | Admitting: Internal Medicine

## 2017-06-30 ENCOUNTER — Encounter (HOSPITAL_COMMUNITY): Payer: Self-pay | Admitting: Internal Medicine

## 2017-06-30 VITALS — BP 133/73 | HR 65 | Temp 97.5°F | Resp 20 | Wt 209.0 lb

## 2017-06-30 DIAGNOSIS — D693 Immune thrombocytopenic purpura: Secondary | ICD-10-CM

## 2017-06-30 DIAGNOSIS — D751 Secondary polycythemia: Secondary | ICD-10-CM | POA: Diagnosis not present

## 2017-06-30 DIAGNOSIS — D479 Neoplasm of uncertain behavior of lymphoid, hematopoietic and related tissue, unspecified: Secondary | ICD-10-CM

## 2017-06-30 DIAGNOSIS — N281 Cyst of kidney, acquired: Secondary | ICD-10-CM

## 2017-06-30 NOTE — Patient Instructions (Signed)
[facility] at [facility] Discharge Instructions  RECOMMENDATIONS MADE BY THE CONSULTANT AND ANY TEST RESULTS WILL BE SENT TO YOUR REFERRING PHYSICIAN.  You were seen today by Dr. Peru  Thank you for choosing  Cancer Center at [facility] to provide your oncology and hematology care.  To afford each patient quality time with our provider, please arrive at least 15 minutes before your scheduled appointment time.    If you have a lab appointment with the Cancer Center please come in thru the  Main Entrance and check in at the main information desk  You need to re-schedule your appointment should you arrive 10 or more minutes late.  We strive to give you quality time with our providers, and arriving late affects you and other patients whose appointments are after yours.  Also, if you no show three or more times for appointments you may be dismissed from the clinic at the providers discretion.     Again, thank you for choosing [facility].  Our hope is that these requests will decrease the amount of time that you wait before being seen by our physicians.       _____________________________________________________________  Should you have questions after your visit to [facility], please contact our office at [phone] between the hours of 8:30 a.m. and 4:30 p.m.  Voicemails left after 4:30 p.m. will not be returned until the following business day.  For prescription refill requests, have your pharmacy contact our office.       Resources For Cancer Patients and their Caregivers ? American Cancer Society: Can assist with transportation, wigs, general needs, runs Look Good Feel Better.        1-888-227-6333 ? Cancer Care: Provides financial assistance, online support groups, medication/co-pay assistance.  1-800-813-HOPE (4673) ? [facility] Assists [place] cancer patients and their families  through emotional , educational and financial support.  336-427-4357 ? [place] DSS Where to apply for food stamps, Medicaid and utility assistance. 336-342-1394 ? [facility]: Transportation to medical appointments. 336-347-2287 ? Social Security Administration: May apply for disability if have a Stage IV cancer. 336-342-7796 1-800-772-1213 ? [place] Aging, Disability and Transit Services: Assists with nutrition, care and transit needs. 336-349-2343  Cancer Center Support Programs: @10RELATIVEDAYS@ > Cancer Support Group  2nd Tuesday of the month 1pm-2pm, Journey Room  > Creative Journey  3rd Tuesday of the month 1130am-1pm, Journey Room  > Look Good Feel Better  1st Wednesday of the month 10am-12 noon, Journey Room (Call American Cancer Society to register 1-800-395-5775)     

## 2017-06-30 NOTE — Progress Notes (Signed)
Chief Complaint: Indeterminate Lymphoproliferative disorder ITP Secondary Polycythemia.  History of Presenting Illness:  John Mcgrath 72 y.o. was seen in the  [place] for diagnosis of an incompletely charaterized lymphoproliferative disorder in 2015. 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  It appears that he was lost to follow-up.  He was  referred back to [place] to reestablish care and was seen by Dr. Lebron Conners at [facility] for consultation on 06/19/2017.  On 06/19/17, Dr. Lebron Conners noted no palpable adenopathy, no B symptoms, imrpoved leukocytosis and imrpoved polycythemia, but persistent mild thrombocytopneia  Therefore, he ordered a  repeat peripheral blood flow cytometry and a CT of the abdomen/pelvis to assess for possible lymphadenopathy or splenomegaly  He returns today for f/u to discuss these results  He has past medical history significant for COPD, oxygen dependent at this time, stage III chronic kidney disease, atrial fibrillation on rivaroxaban, hypertension, hyperlipidemia.    Old records show: --Labs, 08/28/13: WBC 10.7, ANC 4.7, ALC 5.0, Mono 0.8, Eos 0.2, Baso 0.0, Hgb 17.9, Hct 52.8, MCV 95.1, MCH 32.3, RDW 13.2, Plt 147; JAK2 & BCR/ABL -- negative; --pBlood FlowCyto, 08/30/13: Positive for monoclonal lymphocyte population, 5% of the ALC; Flow positive for CD19, CD20, CD 21, CD22, CD23, HLA-DR, kappa light chain, CD25, FMC7 & negative for CD5, CD10, ZAP-70 --Labs, 03/14/17: WBC   8.4, ANC 4.7, ALC 2.7, Mono 0.8, Eos 0.2, Baso 0.0, Hgb 16.8, Hct     ..., MCV 96.5, MCH 32.9, RDW 14.6, Plt 126;    Medical History: Past Medical History:  Diagnosis Date  . Asthma   . Borderline  diabetic   . Cataract    starting to form per pt per eye md  . COPD (chronic obstructive pulmonary disease) ([place])   . Gout    pt denies  . High cholesterol   . Hypertension   . Leukocytosis   . Polycythemia   . TIA (transient ischemic attack) 20 y ago    Medications:  Current Outpatient Medications  Medication Sig Dispense Refill  . allopurinol (ZYLOPRIM) 100 MG tablet Take 200 mg by mouth daily.     . fexofenadine (ALLEGRA) 180 MG tablet Take 180 mg by mouth daily.  0  . furosemide (LASIX) 20 MG tablet Take 20 mg by mouth daily.     Marland Kitchen ipratropium-albuterol (DUONEB) 0.5-2.5 (3) MG/3ML SOLN Inhale 3 mLs into the lungs daily.     . metoprolol succinate (TOPROL-XL) 100 MG 24 hr tablet Take 100 mg by mouth 2 (two) times daily. Take with or immediately following a meal.    . tiotropium (SPIRIVA) 18 MCG inhalation capsule Place 18 mcg into inhaler and inhale daily.    Alveda Reasons 20 MG TABS tablet Take 20 mg by mouth every morning.      No current facility-administered medications for this visit.     Review of Systems: Review of Systems  All other systems reviewed and are negative.    PHYSICAL EXAMINATION. Vitals:   06/30/17 1530  Weight: 209 lb (94.8 kg)   ECOG PERFORMANCE STATUS: 0 - Asymptomatic  Physical Exam  Constitutional: He is oriented to person, place, and time and well-developed, well-nourished, and in no distress. No distress.  He is AAOX3. Memory normal, mood is  normal.   LABORATORY DATA: I have personally reviewed the data as listed: No visits with results within 1 Week(s) from this visit.  Latest known visit with results is:  Appointment on 06/08/2017  Component Date Value Ref Range Status  . WBC 06/08/2017 9.3  4.0 - 10.5 K/uL Final  . RBC 06/08/2017 5.03  4.22 - 5.81 MIL/uL Final  . Hemoglobin 06/08/2017 15.9  13.0 - 17.0 g/dL Final  . HCT 06/08/2017 47.5  39.0 - 52.0 % Final  . MCV 06/08/2017 94.4  78.0 - 100.0 fL Final  . MCH 06/08/2017 31.6  26.0 -  34.0 pg Final  . MCHC 06/08/2017 33.5  30.0 - 36.0 g/dL Final  . RDW 06/08/2017 14.4  11.5 - 15.5 % Final  . Platelets 06/08/2017 125* 150 - 400 K/uL Final  . Neutrophils Relative % 06/08/2017 53  % Final  . Neutro Abs 06/08/2017 5.0  1.7 - 7.7 K/uL Final  . Lymphocytes Relative 06/08/2017 37  % Final  . Lymphs Abs 06/08/2017 3.4  0.7 - 4.0 K/uL Final  . Monocytes Relative 06/08/2017 8  % Final  . Monocytes Absolute 06/08/2017 0.7  0.1 - 1.0 K/uL Final  . Eosinophils Relative 06/08/2017 2  % Final  . Eosinophils Absolute 06/08/2017 0.1  0.0 - 0.7 K/uL Final  . Basophils Relative 06/08/2017 0  % Final  . Basophils Absolute 06/08/2017 0.0  0.0 - 0.1 K/uL Final  . Sodium 06/08/2017 135  135 - 145 mmol/L Final  . Potassium 06/08/2017 4.1  3.5 - 5.1 mmol/L Final  . Chloride 06/08/2017 98* 101 - 111 mmol/L Final  . CO2 06/08/2017 28  22 - 32 mmol/L Final  . Glucose, Bld 06/08/2017 140* 65 - 99 mg/dL Final  . BUN 06/08/2017 14  6 - 20 mg/dL Final  . Creatinine, Ser 06/08/2017 1.21  0.61 - 1.24 mg/dL Final  . Calcium 06/08/2017 8.8* 8.9 - 10.3 mg/dL Final  . Total Protein 06/08/2017 6.2* 6.5 - 8.1 g/dL Final  . Albumin 06/08/2017 3.6  3.5 - 5.0 g/dL Final  . AST 06/08/2017 19  15 - 41 U/L Final  . ALT 06/08/2017 16* 17 - 63 U/L Final  . Alkaline Phosphatase 06/08/2017 65  38 - 126 U/L Final  . Total Bilirubin 06/08/2017 0.5  0.3 - 1.2 mg/dL Final  . GFR calc non Af Amer 06/08/2017 59* >60 mL/min Final  . GFR calc Af Amer 06/08/2017 >60  >60 mL/min Final   Comment: (NOTE) The eGFR has been calculated using the CKD EPI equation. This calculation has not been validated in all clinical situations. eGFR's persistently <60 mL/min signify possible Chronic Kidney Disease.   . Anion gap 06/08/2017 9  5 - 15 Final  . LDH 06/08/2017 137  98 - 192 U/L Final  . Beta-2 Microglobulin 06/08/2017 2.6* 0.6 - 2.4 mg/L Final   Comment: (NOTE) Siemens Immulite 2000 Immunochemiluminometric assay  (ICMA) Values obtained with different assay methods or kits cannot be used interchangeably. Results cannot be interpreted as absolute evidence of the presence or absence of malignant disease. Performed At: [facility] [place], [place] [id number] Rush Farmer MD [id number]   . Uric Acid, Serum 06/08/2017 5.8  4.4 - 7.6 mg/dL Final   Zap 70 neg CD 38 positive  CT abdomen and pelvis w contrast form 06/19/17 has been reviewed as noted below: 1. New 4 cm right lower pole renal mass, indeterminate though concerning for renal cell carcinoma. Increased size and  number of smaller renal lesions bilaterally, some of which are consistent with cysts of varying complexity while others are indeterminate. Contrast-enhanced abdominal MRI is recommended for further characterization of these lesions. 2. Possible early morphologic changes of cirrhosis with several small areas of peripheral hyperenhancement in both hepatic lobes. These can also be further characterized on MRI. 3. No splenomegaly or lymphadenopathy. 4. Small right pleural effusion, decreased from 02/2017 chest CTA. Mild residual consolidation or atelectasis in the right lower lobe with associated bronchial wall thickening. 5.  Aortic Atherosclerosis (ICD10-I70.0).  Plan: ITP persistent mild thrombocytopenia- platelet ct today is 12 no bleeing- observe  B cell lymphoproliferative disorder- No peripehral blood leukocytosis today Peripheral blood flow cytometry shows CD 5 neg CD 38 postiive, Zap 70 negative B lymphocytosis  No lymphadenopathy or hepatosplenomegaly- I could not find the actual flow cytometry record in chart except the one specific for Zap 70. A FISH panel to rule out Mantle cell is needed.   I recommended that we repeat a flow and FISH panel, but he declined to have any test today  Secondary polycythemia- improvign with continued use of Oxygen. Hgb 15.9- UNL Decreasing right pleural  effusion-  he is being followed by PCP  Bilateral kidney cysts, growing with one suspicious right kidney lower pole mass 4 cm - need better visualization with MRI Liver shows indeterminate areas - also needing MRI characteriztion I recommended an MR abdomen, but once again patient refused to have any tests. He does not wsih to return here for follow up. I explained to him that we may be looking at a malignant tumor in the kidney and that tests are meant to help treat disease early before metastasis.  Despite understanding, he declines to have any additional testing. He will return to PCP for continued follow- up.   He is seeing endocrinology for thyroid abnormalities     Creola Corn, MD

## 2017-07-20 ENCOUNTER — Telehealth (HOSPITAL_COMMUNITY): Payer: Self-pay

## 2017-07-20 NOTE — Telephone Encounter (Signed)
Spoke with John Mcgrath at [facility] in [place]. Patients PCP is no longer Angelina Ok, NP. He sees Barry Dienes, NP. Explained that patient refuses to come for follow up. Explained he needed to come for follow up because CT scans shows abnormalities that are suspicious for renal cancer. Note faxed to PCP office. Unable to reach patient at this time.

## 2017-07-20 NOTE — Telephone Encounter (Signed)
-----   Message from Joanne Gavel, RN sent at 07/20/2017  9:50 AM EST ----- Do you know how to do this?  Im sure its simply. Lol.  Just a letter and we send it certified?   Danise Mina  ----- Message ----- From: Gerhard Perches, RN Sent: 07/17/2017   6:36 PM To: Amy J Nance, Joanne Gavel, RN  Anderson Malta and Amy,   I think this went to Onc Nurse Pool - can someone make sure this happens? ----- Message ----- From: Creola Corn, MD Sent: 07/16/2017   1:06 PM To: Adella Nissen Nurse Ap  Can you send a  Certified letter to patient and make sure a copy of my note is sent to his PCP, because he refused to return for follow up . His CT shows abnormalites that are suspicious for renal cancer, needing follow up. Please make sure you document a note in the chart after contacting PCP and him Thanks

## 2017-07-27 ENCOUNTER — Ambulatory Visit (HOSPITAL_COMMUNITY): Admission: RE | Admit: 2017-07-27 | Payer: Medicare Other | Source: Ambulatory Visit

## 2017-08-01 ENCOUNTER — Ambulatory Visit: Payer: Medicare Other | Admitting: "Endocrinology

## 2017-08-22 ENCOUNTER — Ambulatory Visit: Payer: Medicare Other | Admitting: "Endocrinology

## 2018-01-09 ENCOUNTER — Encounter: Payer: Self-pay | Admitting: Internal Medicine

## 2019-05-03 IMAGING — DX DG CHEST 2V
2 series · 2 of 2 positions shown · non-contrast
Comparison: 11/07/2015

CLINICAL DATA: Coughing up blood since [REDACTED]. No power since
[REDACTED]. Unable to use home oxygen. Decreased oxygen saturation.

EXAM:
CHEST  2 VIEW

[chest lat]
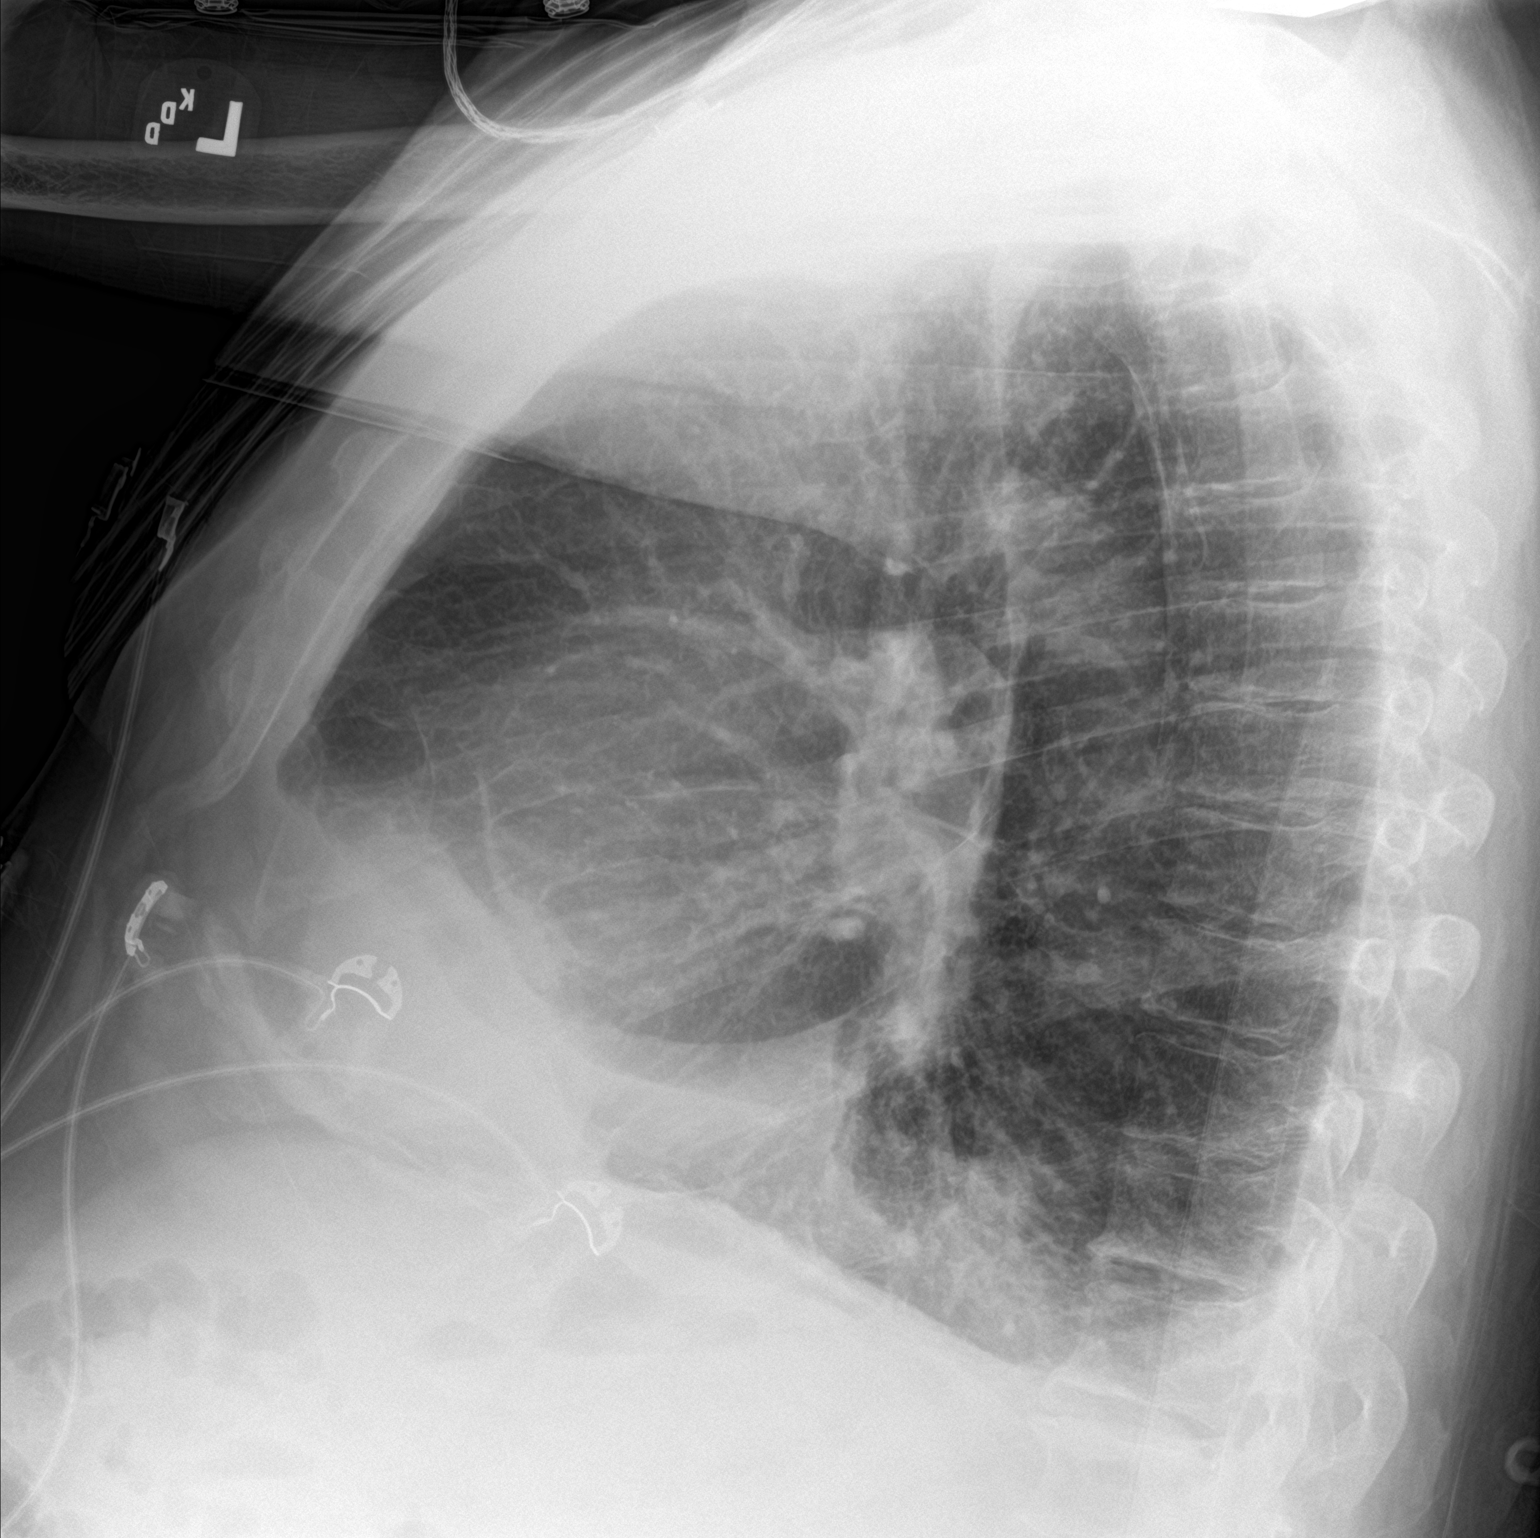

[chest ap]
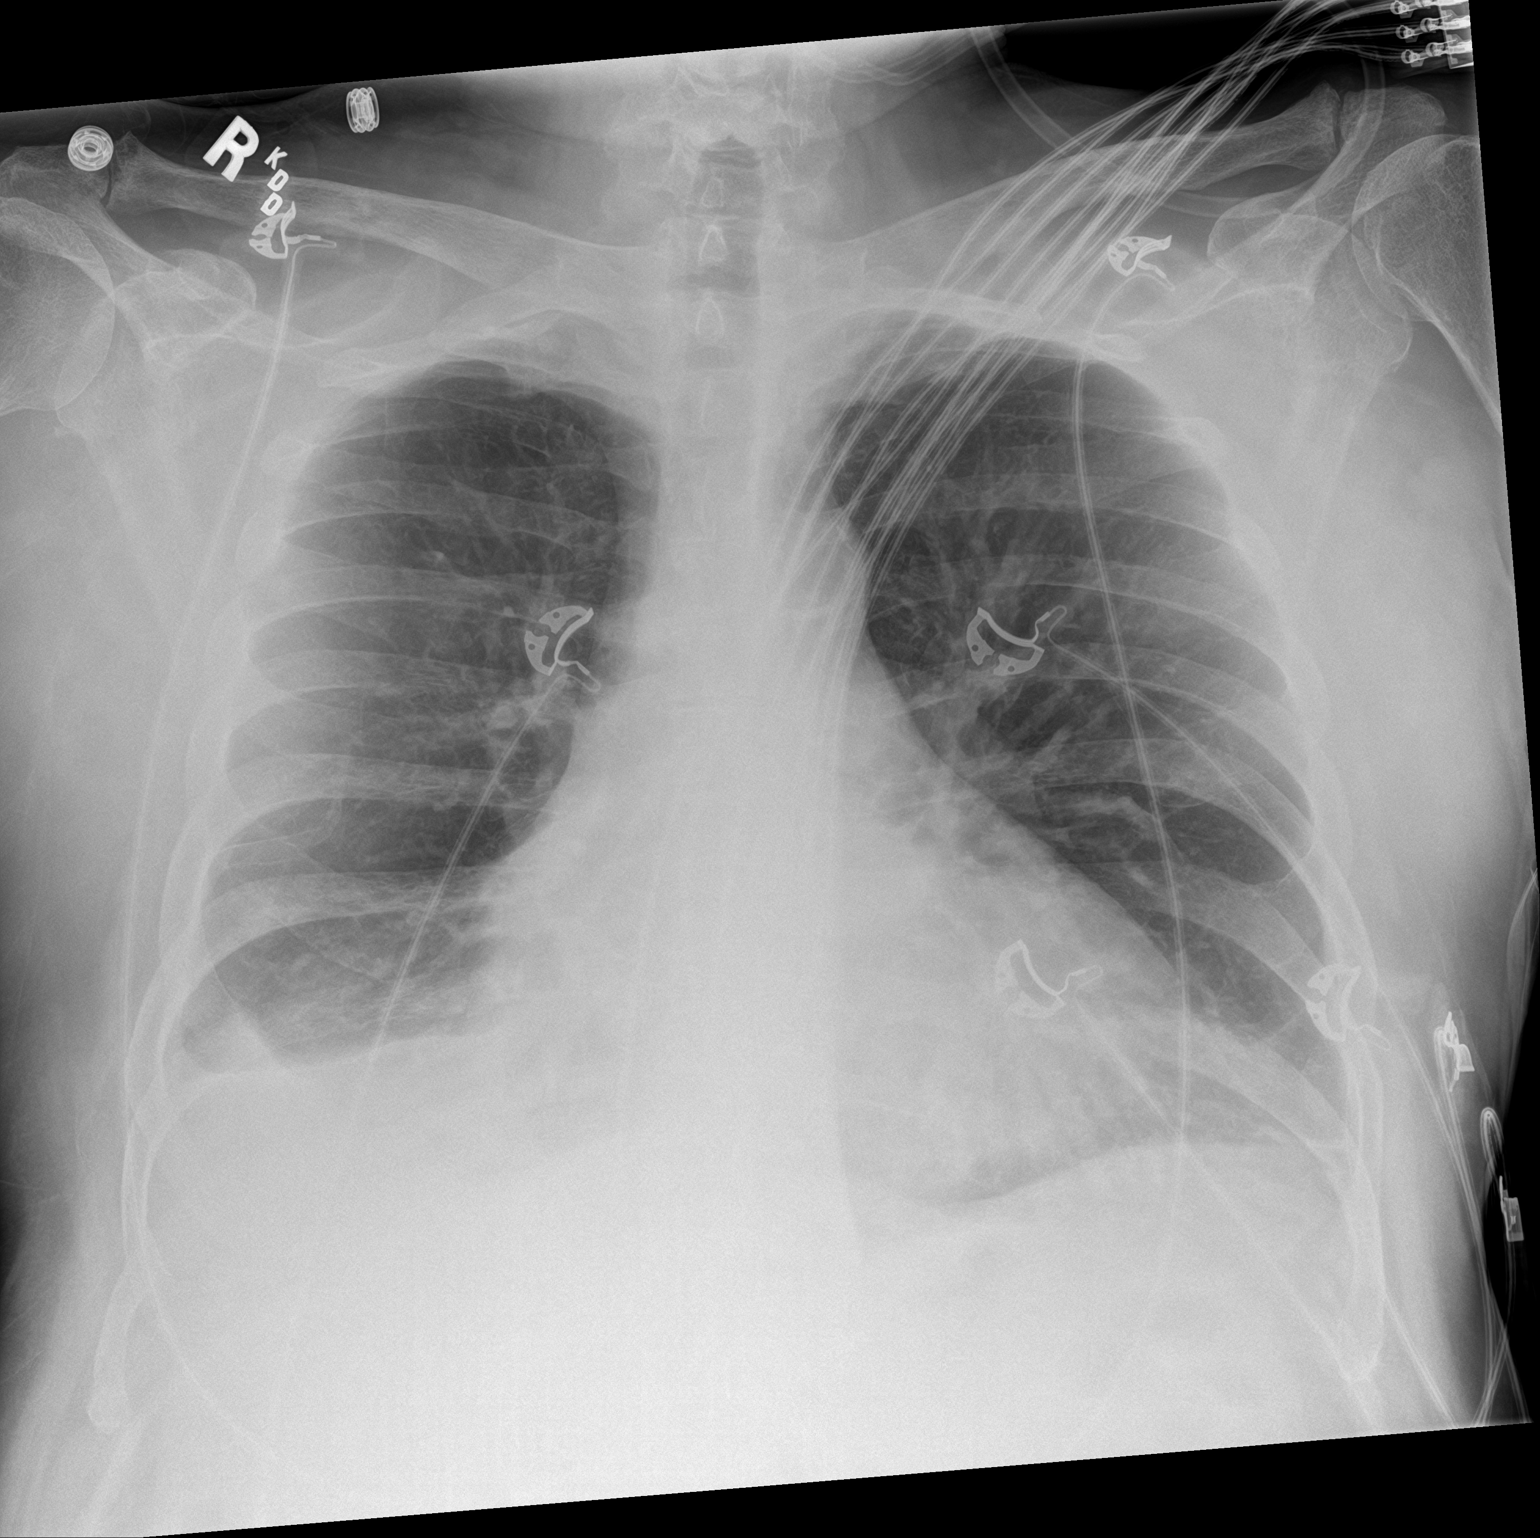

[2 of 2 positions shown; findings below may reference images not displayed]

FINDINGS: Mild cardiac enlargement. No vascular congestion or edema. Small
bilateral pleural effusions with basilar atelectasis. No
pneumothorax. No focal consolidation. Calcification of the aorta.
Degenerative changes in the spine.
IMPRESSION: Small bilateral pleural effusions with basilar atelectasis. Cardiac
enlargement. No focal consolidation or edema.

## 2019-08-01 ENCOUNTER — Encounter (HOSPITAL_COMMUNITY): Payer: Self-pay | Admitting: Emergency Medicine

## 2019-08-01 ENCOUNTER — Emergency Department (HOSPITAL_COMMUNITY): Payer: Medicare PPO

## 2019-08-01 ENCOUNTER — Other Ambulatory Visit: Payer: Self-pay

## 2019-08-01 ENCOUNTER — Inpatient Hospital Stay (HOSPITAL_COMMUNITY)
Admission: EM | Admit: 2019-08-01 | Discharge: 2019-08-30 | DRG: 871 | Disposition: E | Payer: Medicare PPO | Attending: Pulmonary Disease | Admitting: Pulmonary Disease

## 2019-08-01 DIAGNOSIS — D693 Immune thrombocytopenic purpura: Secondary | ICD-10-CM | POA: Diagnosis present

## 2019-08-01 DIAGNOSIS — R0602 Shortness of breath: Secondary | ICD-10-CM | POA: Diagnosis present

## 2019-08-01 DIAGNOSIS — N183 Chronic kidney disease, stage 3 unspecified: Secondary | ICD-10-CM | POA: Diagnosis present

## 2019-08-01 DIAGNOSIS — I130 Hypertensive heart and chronic kidney disease with heart failure and stage 1 through stage 4 chronic kidney disease, or unspecified chronic kidney disease: Secondary | ICD-10-CM | POA: Diagnosis present

## 2019-08-01 DIAGNOSIS — F17210 Nicotine dependence, cigarettes, uncomplicated: Secondary | ICD-10-CM | POA: Diagnosis present

## 2019-08-01 DIAGNOSIS — R6521 Severe sepsis with septic shock: Secondary | ICD-10-CM | POA: Diagnosis present

## 2019-08-01 DIAGNOSIS — E7800 Pure hypercholesterolemia, unspecified: Secondary | ICD-10-CM | POA: Diagnosis present

## 2019-08-01 DIAGNOSIS — Z7901 Long term (current) use of anticoagulants: Secondary | ICD-10-CM | POA: Diagnosis not present

## 2019-08-01 DIAGNOSIS — J80 Acute respiratory distress syndrome: Secondary | ICD-10-CM | POA: Diagnosis present

## 2019-08-01 DIAGNOSIS — J1282 Pneumonia due to coronavirus disease 2019: Secondary | ICD-10-CM | POA: Diagnosis present

## 2019-08-01 DIAGNOSIS — E1122 Type 2 diabetes mellitus with diabetic chronic kidney disease: Secondary | ICD-10-CM | POA: Diagnosis present

## 2019-08-01 DIAGNOSIS — G4733 Obstructive sleep apnea (adult) (pediatric): Secondary | ICD-10-CM | POA: Diagnosis present

## 2019-08-01 DIAGNOSIS — J440 Chronic obstructive pulmonary disease with acute lower respiratory infection: Secondary | ICD-10-CM | POA: Diagnosis present

## 2019-08-01 DIAGNOSIS — C9110 Chronic lymphocytic leukemia of B-cell type not having achieved remission: Secondary | ICD-10-CM | POA: Diagnosis present

## 2019-08-01 DIAGNOSIS — J9601 Acute respiratory failure with hypoxia: Secondary | ICD-10-CM | POA: Diagnosis not present

## 2019-08-01 DIAGNOSIS — Z8673 Personal history of transient ischemic attack (TIA), and cerebral infarction without residual deficits: Secondary | ICD-10-CM | POA: Diagnosis not present

## 2019-08-01 DIAGNOSIS — Z9981 Dependence on supplemental oxygen: Secondary | ICD-10-CM

## 2019-08-01 DIAGNOSIS — Z515 Encounter for palliative care: Secondary | ICD-10-CM | POA: Diagnosis not present

## 2019-08-01 DIAGNOSIS — I469 Cardiac arrest, cause unspecified: Secondary | ICD-10-CM | POA: Diagnosis not present

## 2019-08-01 DIAGNOSIS — I5033 Acute on chronic diastolic (congestive) heart failure: Secondary | ICD-10-CM | POA: Diagnosis present

## 2019-08-01 DIAGNOSIS — N1831 Chronic kidney disease, stage 3a: Secondary | ICD-10-CM | POA: Diagnosis not present

## 2019-08-01 DIAGNOSIS — I4891 Unspecified atrial fibrillation: Secondary | ICD-10-CM | POA: Diagnosis present

## 2019-08-01 DIAGNOSIS — Z66 Do not resuscitate: Secondary | ICD-10-CM | POA: Diagnosis not present

## 2019-08-01 DIAGNOSIS — Z888 Allergy status to other drugs, medicaments and biological substances status: Secondary | ICD-10-CM

## 2019-08-01 DIAGNOSIS — J449 Chronic obstructive pulmonary disease, unspecified: Secondary | ICD-10-CM

## 2019-08-01 DIAGNOSIS — U071 COVID-19: Secondary | ICD-10-CM | POA: Diagnosis present

## 2019-08-01 DIAGNOSIS — N179 Acute kidney failure, unspecified: Secondary | ICD-10-CM | POA: Diagnosis not present

## 2019-08-01 DIAGNOSIS — A4189 Other specified sepsis: Secondary | ICD-10-CM | POA: Diagnosis present

## 2019-08-01 LAB — PROCALCITONIN: Procalcitonin: 0.24 ng/mL

## 2019-08-01 LAB — COMPREHENSIVE METABOLIC PANEL
ALT: 14 U/L (ref 0–44)
AST: 17 U/L (ref 15–41)
Albumin: 2.6 g/dL — ABNORMAL LOW (ref 3.5–5.0)
Alkaline Phosphatase: 70 U/L (ref 38–126)
Anion gap: 9 (ref 5–15)
BUN: 45 mg/dL — ABNORMAL HIGH (ref 8–23)
CO2: 28 mmol/L (ref 22–32)
Calcium: 8.1 mg/dL — ABNORMAL LOW (ref 8.9–10.3)
Chloride: 95 mmol/L — ABNORMAL LOW (ref 98–111)
Creatinine, Ser: 1.44 mg/dL — ABNORMAL HIGH (ref 0.61–1.24)
GFR calc Af Amer: 56 mL/min — ABNORMAL LOW (ref >60)
GFR calc non Af Amer: 49 mL/min — ABNORMAL LOW (ref >60)
Glucose, Bld: 112 mg/dL — ABNORMAL HIGH (ref 70–99)
Potassium: 5.2 mmol/L — ABNORMAL HIGH (ref 3.5–5.1)
Sodium: 132 mmol/L — ABNORMAL LOW (ref 135–145)
Total Bilirubin: 1.3 mg/dL — ABNORMAL HIGH (ref 0.3–1.2)
Total Protein: 5.4 g/dL — ABNORMAL LOW (ref 6.5–8.1)

## 2019-08-01 LAB — FERRITIN: Ferritin: 244 ng/mL (ref 24–336)

## 2019-08-01 LAB — CBC WITH DIFFERENTIAL/PLATELET
Abs Immature Granulocytes: 0.12 10*3/uL — ABNORMAL HIGH (ref 0.00–0.07)
Basophils Absolute: 0 10*3/uL (ref 0.0–0.1)
Basophils Relative: 0 %
Eosinophils Absolute: 0 10*3/uL (ref 0.0–0.5)
Eosinophils Relative: 0 %
HCT: 51.8 % (ref 39.0–52.0)
Hemoglobin: 16.9 g/dL (ref 13.0–17.0)
Immature Granulocytes: 1 %
Lymphocytes Relative: 19 %
Lymphs Abs: 2.1 10*3/uL (ref 0.7–4.0)
MCH: 30.6 pg (ref 26.0–34.0)
MCHC: 32.6 g/dL (ref 30.0–36.0)
MCV: 93.8 fL (ref 80.0–100.0)
Monocytes Absolute: 0.5 10*3/uL (ref 0.1–1.0)
Monocytes Relative: 5 %
Neutro Abs: 8.5 10*3/uL — ABNORMAL HIGH (ref 1.7–7.7)
Neutrophils Relative %: 75 %
Platelets: 101 10*3/uL — ABNORMAL LOW (ref 150–400)
RBC: 5.52 MIL/uL (ref 4.22–5.81)
RDW: 15 % (ref 11.5–15.5)
WBC: 11.3 10*3/uL — ABNORMAL HIGH (ref 4.0–10.5)
nRBC: 0 % (ref 0.0–0.2)

## 2019-08-01 LAB — URINALYSIS, ROUTINE W REFLEX MICROSCOPIC
Bacteria, UA: NONE SEEN
Bilirubin Urine: NEGATIVE
Glucose, UA: NEGATIVE mg/dL
Ketones, ur: NEGATIVE mg/dL
Leukocytes,Ua: NEGATIVE
Nitrite: NEGATIVE
Protein, ur: 100 mg/dL — AB
Specific Gravity, Urine: 1.019 (ref 1.005–1.030)
pH: 5 (ref 5.0–8.0)

## 2019-08-01 LAB — TSH: TSH: 0.035 u[IU]/mL — ABNORMAL LOW (ref 0.350–4.500)

## 2019-08-01 LAB — C-REACTIVE PROTEIN: CRP: 6.6 mg/dL — ABNORMAL HIGH (ref <1.0)

## 2019-08-01 LAB — TROPONIN I (HIGH SENSITIVITY)
Troponin I (High Sensitivity): 29 ng/L — ABNORMAL HIGH (ref <18)
Troponin I (High Sensitivity): 32 ng/L — ABNORMAL HIGH (ref <18)

## 2019-08-01 LAB — LACTIC ACID, PLASMA
Lactic Acid, Venous: 1.4 mmol/L (ref 0.5–1.9)
Lactic Acid, Venous: 1.1 mmol/L (ref 0.5–1.9)

## 2019-08-01 LAB — BRAIN NATRIURETIC PEPTIDE: B Natriuretic Peptide: 1040 pg/mL — ABNORMAL HIGH (ref 0.0–100.0)

## 2019-08-01 LAB — MAGNESIUM: Magnesium: 1.8 mg/dL (ref 1.7–2.4)

## 2019-08-01 LAB — D-DIMER, QUANTITATIVE: D-Dimer, Quant: 4.99 ug/mL-FEU — ABNORMAL HIGH (ref 0.00–0.50)

## 2019-08-01 LAB — POC SARS CORONAVIRUS 2 AG -  ED: SARS Coronavirus 2 Ag: POSITIVE — AB

## 2019-08-01 LAB — PROTIME-INR
INR: 1.6 — ABNORMAL HIGH (ref 0.8–1.2)
Prothrombin Time: 19.2 seconds — ABNORMAL HIGH (ref 11.4–15.2)

## 2019-08-01 LAB — FIBRINOGEN: Fibrinogen: 470 mg/dL (ref 210–475)

## 2019-08-01 LAB — LACTATE DEHYDROGENASE: LDH: 204 U/L — ABNORMAL HIGH (ref 98–192)

## 2019-08-01 LAB — APTT: aPTT: 33 seconds (ref 24–36)

## 2019-08-01 MED ORDER — ALBUTEROL SULFATE HFA 108 (90 BASE) MCG/ACT IN AERS: 1.0000 | INHALATION_SPRAY | RESPIRATORY_TRACT | Status: DC | PRN

## 2019-08-01 MED ORDER — DILTIAZEM HCL 25 MG/5ML IV SOLN
10.0000 mg | Freq: Once | INTRAVENOUS | Status: AC
  Administered 2019-08-01: 10 mg via INTRAVENOUS
  Filled 2019-08-01: qty 5

## 2019-08-01 MED ORDER — UMECLIDINIUM BROMIDE 62.5 MCG/INH IN AEPB
1.0000 | INHALATION_SPRAY | Freq: Every day | RESPIRATORY_TRACT | Status: DC
  Filled 2019-08-01: qty 7

## 2019-08-01 MED ORDER — METOPROLOL SUCCINATE ER 50 MG PO TB24
100.0000 mg | ORAL_TABLET | Freq: Two times a day (BID) | ORAL | Status: DC
  Administered 2019-08-01: 100 mg via ORAL
  Filled 2019-08-01: qty 4

## 2019-08-01 MED ORDER — ACETAMINOPHEN 325 MG PO TABS: 650.0000 mg | ORAL_TABLET | Freq: Four times a day (QID) | ORAL | Status: DC | PRN

## 2019-08-01 MED ORDER — FUROSEMIDE 10 MG/ML IJ SOLN: 20.0000 mg | Freq: Two times a day (BID) | INTRAMUSCULAR | Status: DC

## 2019-08-01 MED ORDER — RIVAROXABAN 20 MG PO TABS
20.0000 mg | ORAL_TABLET | Freq: Every day | ORAL | Status: DC
  Filled 2019-08-01 (×2): qty 1

## 2019-08-01 MED ORDER — GUAIFENESIN-DM 100-10 MG/5ML PO SYRP
10.0000 mL | ORAL_SOLUTION | ORAL | Status: DC | PRN
  Filled 2019-08-01: qty 10

## 2019-08-01 MED ORDER — VANCOMYCIN HCL IN DEXTROSE 1-5 GM/200ML-% IV SOLN
1000.0000 mg | Freq: Once | INTRAVENOUS | Status: AC
  Administered 2019-08-01: 1000 mg via INTRAVENOUS
  Filled 2019-08-01: qty 200

## 2019-08-01 MED ORDER — ONDANSETRON HCL 4 MG PO TABS: 4.0000 mg | ORAL_TABLET | Freq: Four times a day (QID) | ORAL | Status: DC | PRN

## 2019-08-01 MED ORDER — ZINC SULFATE 220 (50 ZN) MG PO CAPS: 220.0000 mg | ORAL_CAPSULE | Freq: Every day | ORAL | Status: DC

## 2019-08-01 MED ORDER — POLYETHYLENE GLYCOL 3350 17 G PO PACK: 17.0000 g | PACK | Freq: Every day | ORAL | Status: DC | PRN

## 2019-08-01 MED ORDER — ASCORBIC ACID 500 MG PO TABS: 500.0000 mg | ORAL_TABLET | Freq: Every day | ORAL | Status: DC

## 2019-08-01 MED ORDER — LORATADINE 10 MG PO TABS: 10.0000 mg | ORAL_TABLET | Freq: Every evening | ORAL | Status: DC

## 2019-08-01 MED ORDER — DEXAMETHASONE SODIUM PHOSPHATE 10 MG/ML IJ SOLN
10.0000 mg | Freq: Once | INTRAMUSCULAR | Status: AC
  Administered 2019-08-01: 10 mg via INTRAVENOUS
  Filled 2019-08-01: qty 1

## 2019-08-01 MED ORDER — DILTIAZEM HCL-DEXTROSE 125-5 MG/125ML-% IV SOLN (PREMIX)
5.0000 mg/h | INTRAVENOUS | Status: DC
  Administered 2019-08-02: 5 mg/h via INTRAVENOUS
  Administered 2019-08-02: 15 mg/h via INTRAVENOUS
  Filled 2019-08-01: qty 125

## 2019-08-01 MED ORDER — DEXAMETHASONE SODIUM PHOSPHATE 10 MG/ML IJ SOLN: 6.0000 mg | INTRAMUSCULAR | Status: DC

## 2019-08-01 MED ORDER — SODIUM CHLORIDE 0.9 % IV SOLN: 100.0000 mg | Freq: Every day | INTRAVENOUS | Status: DC

## 2019-08-01 MED ORDER — ONDANSETRON HCL 4 MG/2ML IJ SOLN: 4.0000 mg | Freq: Four times a day (QID) | INTRAMUSCULAR | Status: DC | PRN

## 2019-08-01 MED ORDER — ALBUTEROL SULFATE HFA 108 (90 BASE) MCG/ACT IN AERS
2.0000 | INHALATION_SPRAY | Freq: Four times a day (QID) | RESPIRATORY_TRACT | Status: DC
  Administered 2019-08-01: 2 via RESPIRATORY_TRACT
  Filled 2019-08-01: qty 6.7

## 2019-08-01 MED ORDER — ALLOPURINOL 100 MG PO TABS
200.0000 mg | ORAL_TABLET | Freq: Every day | ORAL | Status: DC
  Filled 2019-08-01 (×2): qty 2

## 2019-08-01 MED ORDER — SODIUM CHLORIDE 0.9 % IV SOLN
200.0000 mg | Freq: Once | INTRAVENOUS | Status: AC
  Administered 2019-08-01: 200 mg via INTRAVENOUS
  Filled 2019-08-01: qty 40

## 2019-08-01 MED ORDER — FUROSEMIDE 10 MG/ML IJ SOLN
40.0000 mg | Freq: Once | INTRAMUSCULAR | Status: AC
  Administered 2019-08-01: 40 mg via INTRAVENOUS
  Filled 2019-08-01: qty 4

## 2019-08-01 MED ORDER — PIPERACILLIN-TAZOBACTAM 3.375 G IVPB 30 MIN
3.3750 g | Freq: Once | INTRAVENOUS | Status: AC
  Administered 2019-08-01: 3.375 g via INTRAVENOUS
  Filled 2019-08-01: qty 50

## 2019-08-01 MED ORDER — DILTIAZEM HCL-DEXTROSE 125-5 MG/125ML-% IV SOLN (PREMIX)
5.0000 mg/h | INTRAVENOUS | Status: DC
  Administered 2019-08-01: 5 mg/h via INTRAVENOUS
  Filled 2019-08-01: qty 125

## 2019-08-01 NOTE — H&P (Addendum)
History and Physical    John Mcgrath [id number] DOB: 1945/09/24 DOA: 07/31/2019  PCP: Patient, No Pcp Per   Patient coming from: Home  I have personally briefly reviewed patient's old medical records in [place]  Chief Complaint: SOB  HPI: John Mcgrath is a 74 y.o. 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  Patient was brought to the ED complaints of increasing difficulty breathing, and nonproductive cough, since he tested positive for Covid 07/18/2019.  He is also still having persistent fevers.  Family reports that patient is noncompliant with his 3 L O2 chronically, and he uses a BiPAP at night. Patient denies chest pain, he reports chronic lower extremity swelling that is unchanged.  Also reports compliance with his diuretics and Lasix.  The EMS temperature was 102.1, tylenol given, he was tachycardic in the 150s, 700 mill bolus given in route.  ED Course: O2 sats initially 91% on 4 L O2.  Tachycardic up to 162 per EKG.  WBC 11.3.  Platelets 101.  Normal lactic acid 1.4.  BNP elevated at 1040.  Portable chest x-ray shows bilateral lower lobe airspace opacity which could reflect edema or infection.  Suspect small right effusion. POC Covid test positive.  Blood and urine cultures ordered.  IV vancomycin and Zosyn started in ED.  Cardizem bolus 20 mg given, started on drip.  Hospitalist to admit for further evaluation and management.  Review of Systems: As per HPI all other systems reviewed and negative.  Past Medical History:  Diagnosis Date  . Asthma   . Borderline diabetic   . Cataract    starting to form per pt per eye md  . COPD (chronic obstructive pulmonary disease) ([place])   . Gout    pt denies  . High cholesterol   . Hypertension   . Leukocytosis   . Polycythemia   . TIA (transient ischemic attack) 20 y ago    Past Surgical History:  Procedure Laterality Date  . HERNIA REPAIR   @ 74 years of age & then again @ 74 years of age     reports that he has been smoking. He has a 50.00 pack-year smoking history. He has never used smokeless tobacco. He reports current alcohol use of about 21.0 standard drinks of alcohol per week. He reports that he does not use drugs.  Allergies  Allergen Reactions  . Statins Itching    Family History  Problem Relation Age of Onset  . Obesity Daughter     Prior to Admission medications   Medication Sig Start Date End Date Taking? Authorizing Provider  allopurinol (ZYLOPRIM) 100 MG tablet Take 200 mg by mouth in the morning.    Yes [provider]  amLODipine-benazepril (LOTREL) 10-40 MG capsule Take 1 capsule by mouth in the morning.  04/30/19  Yes [provider]  ezetimibe (ZETIA) 10 MG tablet Take 10 mg by mouth every evening.  05/21/19  Yes [provider]  furosemide (LASIX) 20 MG tablet Take 20 mg by mouth in the morning.  11/06/15  Yes [provider]  ipratropium-albuterol (DUONEB) 0.5-2.5 (3) MG/3ML SOLN Inhale 1.5-3 mLs into the lungs every 6 (six) hours as needed (for shortness of breath).  10/02/15  Yes [provider]  loratadine (CLARITIN) 10 MG tablet Take 10 mg by mouth every evening.    Yes [provider]  metoprolol succinate (TOPROL-XL) 100 MG 24 hr  tablet Take 100 mg by mouth 2 (two) times daily. Take with or immediately following a meal.   Yes [provider]  tiotropium (SPIRIVA) 18 MCG inhalation capsule Place 18 mcg into inhaler and inhale daily.   Yes [provider]  XARELTO 20 MG TABS tablet Take 20 mg by mouth in the morning.  10/06/15  Yes [provider]    Physical Exam: Vitals:   08/08/2019 1715 08/10/2019 1730 08/27/2019 1742 07/31/2019 1900  BP:  (!) 154/129  110/71  Pulse: 77 (!) 119  71  Resp: (!) 21 (!) 21  18  Temp:      TempSrc:      SpO2: (!) 80% (!) 80% 92% 94%  Weight:      Height:        Constitutional: Chronically  ill-appearing, Vitals:   08/03/2019 1715 08/12/2019 1730 08/25/2019 1742 08/05/2019 1900  BP:  (!) 154/129  110/71  Pulse: 77 (!) 119  71  Resp: (!) 21 (!) 21  18  Temp:      TempSrc:      SpO2: (!) 80% (!) 80% 92% 94%  Weight:      Height:       Eyes: PERRL, lids and conjunctivae normal ENMT: Mucous membranes are moist.  Neck: normal, supple, no masses, no thyromegaly Respiratory: Moderate increased work of breathing, with accessory muscle use  Cardiovascular: Tachycardic, irregular egular rate and rhythm, 1+ pitting extremity edema to mid leg, with chronic stasis changes, 2+ pedal pulses.   Abdomen: no tenderness, no masses palpated. No hepatosplenomegaly. Bowel sounds positive.  Musculoskeletal: no clubbing / cyanosis. No joint deformity upper and lower extremities. Good ROM, no contractures. Normal muscle tone.  Skin: no rashes, lesions, ulcers. No induration Neurologic: No apparent cranial abnormality, moving all extremities spontaneously. Psychiatric: Normal judgment and insight. Alert and oriented x 3. Normal mood.   Labs on Admission: I have personally reviewed following labs and imaging studies  CBC: Recent Labs  Lab 08/19/2019 1615  WBC 11.3*  NEUTROABS 8.5*  HGB 16.9  HCT 51.8  MCV 93.8  PLT 99991111*   Basic Metabolic Panel: Recent Labs  Lab 08/09/2019 1615  NA 132*  K 5.2*  CL 95*  CO2 28  GLUCOSE 112*  BUN 45*  CREATININE 1.44*  CALCIUM 8.1*   Liver Function Tests: Recent Labs  Lab 07/30/2019 1615  AST 17  ALT 14  ALKPHOS 70  BILITOT 1.3*  PROT 5.4*  ALBUMIN 2.6*   Coagulation Profile: Recent Labs  Lab 08/13/2019 1615  INR 1.6*    Radiological Exams on Admission: DG Chest Port 1 View  Result Date: 08/25/2019 CLINICAL DATA:  Shortness of breath EXAM: PORTABLE CHEST 1 VIEW COMPARISON:  03/14/2017 FINDINGS: Cardiomegaly. Bilateral lower lobe and perihilar airspace disease. Suspect small right effusion. No pneumothorax or acute bony abnormality. IMPRESSION:  Cardiomegaly. Bilateral lower lobe airspace opacities could reflect edema or infection. Suspect small right effusion. Electronically Signed   By: Rolm Baptise M.D.   On: 08/28/2019 16:38    EKG: Independently reviewed.  Atrial fibrillation rate 162.  QTc 437.  Assessment/Plan Principal Problem:   Pneumonia due to COVID-19 virus Active Problems:   Chronic lymphocytic leukemia (HCC)   Thrombocytopenia, immune (HCC)   CKD (chronic kidney disease) stage 3, GFR 30-59 ml/min   Atrial fibrillation with rapid ventricular response ([place])   COVID-19 pneumonia with acute on chronic respiratory failure-O2 sats currently greater than 91% on 4 L O2, at baseline on 3  L.  Persistent cough, dyspnea, fevers, temp 102 prior to arrival.  Procalcitonin 0.24 discouraging antibiotic use. -Remdesivir pharmacy to dose -Dexamethasone 10 mg x 1 continue 6 mg daily -Daily inflammatory markers -COVID-19 admission protocol -CBC, CMP daily -Mucolytic's, supplemental O2 -Flutter valve, incentive spirometry, PRN bronchodilators -Multivitamins zinc vitamin C -Discontinue IV antibiotic started in ED vancomycin and Zosyn.  Atrial fibrillation with RVR-heart rate up to 160s, improving.  RVR likely driven by acute viral infection.  History of atrial fibrillation. On anticoagulation with Xarelto. -20 mg Cardizem given, continue Cardizem gtt, goal heart rate < 120. -Resume home metoprolol 100 mg every 12 hourly -Check TSH, magnesium, trend troponin -Resume Xarelto -Consider cardiology consult in a.m. if persistently in RVR  Acute on chronic diastolic CHF-chronic lower extremity swelling, chest x-ray edema versus pneumonia.  BNP markedly elevated at 1040.  No prior echocardiogram on file or Care Everywhere. Reports compliance with lasix 20mg  daily. -Obtain echocardiogram -IV Lasix 40x1 given in ED, continue lasix at 20mg  q12hr for now, till improvement blood pressure. - Strict input output, daily weight.  COPD  exacerbation-chronically on 3 L.  Uses BiPAP at home. - Steroids, PRN and scheduled bronchodilator inhalers.  Chronic lymphocytic leukemia, immune thrombocytopenia-platelets 101, last checked 2 years ago, platelets was 125.  Last oncology visit 05/2017, per notes patient refused to have further testing done on that day, it appears he has not followed up since then. -CBC a.m. -Continue Xarelto for now  Hypertension- Soft blood pressure on Cardizem drip. -Hold home amlodipine benazepril -Resume home metoprolol 100 mg every 12 hourly  - gentle diureses  CKD 3- stable. Cr 1.44, baseline 1.2- 1.5. - IV lasix - CMP a.m  DVT prophylaxis: Xarelto Code Status: Full code Family Communication: None at bedside Disposition Plan: > 2 days, pending improvement in heart rate, respiratory status, and treatment for Covid pneumonia. Consults called: none Admission status: Inpatient, stepdown I certify that at the point of admission it is my clinical judgment that the patient will require inpatient hospital care spanning beyond 2 midnights from the point of admission due to high intensity of service, high risk for further deterioration and high frequency of surveillance required.   Bethena Roys MD [facility]  08/08/2019, 8:40 PM

## 2019-08-01 NOTE — ED Triage Notes (Signed)
PT brought in from home today by [facility]. Family called EMS and reported pt is Covid + since 07/18/2019 [facility] [place] and SOB and fever continues to worsens. PT is supposed to be on 3L o2 chronically but is non-complaint per his family and uses a bipap at night. EMS reports pt has temp of 102.1 oral and given 1000 mg of tylenol and tachycardic at 150s and Normal Saline initated with about 700 mL given in route. PT sats are 91% now on 4L with non-productive congested cough present.

## 2019-08-01 NOTE — Progress Notes (Signed)
Pt was brought in for COVID. He was tested positive on 2/17 but has gotten worse. He never got remdesivir before. It has been ordered here.  ALT wnl  Remdesivir 200mg  IV x1 then 100mg  IV q24 x4  Onnie Boer, PharmD, [place], AAHIVP, CPP Infectious Disease Pharmacist 08/04/2019 6:44 PM

## 2019-08-01 NOTE — ED Provider Notes (Signed)
[facility] Provider Note   CSN: [id number] Arrival date & time: 08/19/2019  1555     History Chief Complaint  Patient presents with  . Shortness of Breath    John Mcgrath is a 74 y.o. male.  Patient presents with fever and shortness of breath.  Patient has a history of congestive heart failure and Covid.  The history is provided by the patient. No language interpreter was used.  Shortness of Breath Severity:  Severe Onset quality:  Sudden Timing:  Constant Progression:  Worsening Chronicity:  Recurrent Context: activity   Worsened by:  Nothing Associated symptoms: no abdominal pain, no chest pain, no cough, no headaches and no rash        Past Medical History:  Diagnosis Date  . Asthma   . Borderline diabetic   . Cataract    starting to form per pt per eye md  . COPD (chronic obstructive pulmonary disease) ([place])   . Gout    pt denies  . High cholesterol   . Hypertension   . Leukocytosis   . Polycythemia   . TIA (transient ischemic attack) 20 y ago    Patient Active Problem List   Diagnosis Date Noted  . Atrial fibrillation with rapid ventricular response ([place]) 08/01/2019  . 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 Subclinical hypothyroidism 05/02/2017  . Thyroid nodule, cold 05/02/2017  . Hyperthyroidism 03/17/2017  . Acute on chronic respiratory failure with hypoxia and hypercapnia ([place]) 03/14/2017  . CAP (community acquired pneumonia) 03/14/2017  . Atrial fibrillation, chronic ([place]) 03/14/2017  . Acute CHF (congestive heart failure) ([place]) 03/14/2017  . CKD (chronic kidney disease) stage 3, GFR 30-59 ml/min 03/14/2017  . Hemoptysis   . Pleural effusion   . Obstructive sleep apnea 10/09/2013  . Chronic lymphocytic leukemia ([place]) 09/21/2013  . Thrombocytopenia, immune ([place]) 09/21/2013  . Leukocytosis 08/28/2013  . Polycythemia 08/28/2013  . COPD with acute exacerbation ([place]) 08/28/2013  . Hypoxia 08/28/2013    Past  Surgical History:  Procedure Laterality Date  . HERNIA REPAIR  @ 74 years of age & then again @ 74 years of age       Family History  Problem Relation Age of Onset  . Obesity Daughter     Social History   Tobacco Use  . Smoking status: Current Every Day Smoker    Packs/day: 1.00    Years: 50.00    Pack years: 50.00  . Smokeless tobacco: Never Used  Substance Use Topics  . Alcohol use: Yes    Alcohol/week: 21.0 standard drinks    Types: 21 Cans of beer per week  . Drug use: No    Home Medications Prior to Admission medications   Medication Sig Start Date End Date Taking? Authorizing Provider  allopurinol (ZYLOPRIM) 100 MG tablet Take 200 mg by mouth in the morning.    Yes [provider]  amLODipine-benazepril (LOTREL) 10-40 MG capsule Take 1 capsule by mouth in the morning.  04/30/19  Yes [provider]  ezetimibe (ZETIA) 10 MG tablet Take 10 mg by mouth every evening.  05/21/19  Yes [provider]  furosemide (LASIX) 20 MG tablet Take 20 mg by mouth in the morning.  11/06/15  Yes [provider]  ipratropium-albuterol (DUONEB) 0.5-2.5 (3) MG/3ML SOLN Inhale 1.5-3 mLs into the lungs every 6 (six) hours as needed (for shortness of breath).  10/02/15  Yes [provider]  loratadine (CLARITIN) 10 MG tablet Take 10 mg by mouth  every evening.    Yes [provider]  metoprolol succinate (TOPROL-XL) 100 MG 24 hr tablet Take 100 mg by mouth 2 (two) times daily. Take with or immediately following a meal.   Yes [provider]  tiotropium (SPIRIVA) 18 MCG inhalation capsule Place 18 mcg into inhaler and inhale daily.   Yes [provider]  XARELTO 20 MG TABS tablet Take 20 mg by mouth in the morning.  10/06/15  Yes [provider]    Allergies    Statins  Review of Systems   Review of Systems  Constitutional: Negative for appetite change and fatigue.  HENT: Negative for congestion, ear discharge and  sinus pressure.   Eyes: Negative for discharge.  Respiratory: Positive for shortness of breath. Negative for cough.   Cardiovascular: Negative for chest pain.  Gastrointestinal: Negative for abdominal pain and diarrhea.  Genitourinary: Negative for frequency and hematuria.  Musculoskeletal: Negative for back pain.  Skin: Negative for rash.  Neurological: Negative for seizures and headaches.  Psychiatric/Behavioral: Negative for hallucinations.    Physical Exam Updated Vital Signs BP (!) 154/129   Pulse (!) 119   Temp 100.3 F (37.9 C) (Oral)   Resp (!) 21   Ht 5\' 9"  (1.753 m)   Wt 95 kg   SpO2 92%   BMI 30.93 kg/m   Physical Exam Nursing note reviewed.  Constitutional:      Appearance: He is well-developed.  HENT:     Head: Normocephalic.     Nose: Nose normal.  Eyes:     General: No scleral icterus.    Conjunctiva/sclera: Conjunctivae normal.  Neck:     Thyroid: No thyromegaly.  Cardiovascular:     Heart sounds: No murmur. No friction rub. No gallop.      Comments: Rapid irregular rate Pulmonary:     Breath sounds: No stridor. No wheezing or rales.  Chest:     Chest wall: No tenderness.  Abdominal:     General: There is no distension.     Tenderness: There is no abdominal tenderness. There is no rebound.  Musculoskeletal:        General: Normal range of motion.     Cervical back: Neck supple.  Lymphadenopathy:     Cervical: No cervical adenopathy.  Skin:    Findings: No erythema or rash.  Neurological:     Mental Status: He is alert and oriented to person, place, and time.     Motor: No abnormal muscle tone.     Coordination: Coordination normal.  Psychiatric:        Behavior: Behavior normal.     ED Results / Procedures / Treatments   Labs (all labs ordered are listed, but only abnormal results are displayed) Labs Reviewed  COMPREHENSIVE METABOLIC PANEL - Abnormal; Notable for the following components:      Result Value   Sodium 132 (*)     Potassium 5.2 (*)    Chloride 95 (*)    Glucose, Bld 112 (*)    BUN 45 (*)    Creatinine, Ser 1.44 (*)    Calcium 8.1 (*)    Total Protein 5.4 (*)    Albumin 2.6 (*)    Total Bilirubin 1.3 (*)    GFR calc non Af Amer 49 (*)    GFR calc Af Amer 56 (*)    All other components within normal limits  CBC WITH DIFFERENTIAL/PLATELET - Abnormal; Notable for the following components:   WBC 11.3 (*)  Platelets 101 (*)    Neutro Abs 8.5 (*)    Abs Immature Granulocytes 0.12 (*)    All other components within normal limits  PROTIME-INR - Abnormal; Notable for the following components:   Prothrombin Time 19.2 (*)    INR 1.6 (*)    All other components within normal limits  BRAIN NATRIURETIC PEPTIDE - Abnormal; Notable for the following components:   B Natriuretic Peptide 1,040.0 (*)    All other components within normal limits  POC SARS CORONAVIRUS 2 AG -  ED - Abnormal; Notable for the following components:   SARS Coronavirus 2 Ag POSITIVE (*)    All other components within normal limits  CULTURE, BLOOD (ROUTINE X 2)  CULTURE, BLOOD (ROUTINE X 2)  URINE CULTURE  LACTIC ACID, PLASMA  APTT  LACTIC ACID, PLASMA  URINALYSIS, ROUTINE W REFLEX MICROSCOPIC  PROCALCITONIN  FERRITIN  FIBRINOGEN  D-DIMER, QUANTITATIVE (NOT AT [facility])  LACTATE DEHYDROGENASE  C-REACTIVE PROTEIN  TROPONIN I (HIGH SENSITIVITY)    EKG EKG Interpretation  Date/Time:  Wednesday August 01 2019 16:13:07 EST Ventricular Rate:  162 PR Interval:    QRS Duration: 77 QT Interval:  267 QTC Calculation: 437 R Axis:   134 Text Interpretation: Atrial fibrillation with rapid V-rate Ventricular premature complex Anterior infarct, old Minimal ST depression, inferior leads Borderline T abnormalities, inferior leads Confirmed by Milton Ferguson [id number]) on 08/21/2019 5:36:25 PM   Radiology DG Chest Port 1 View  Result Date: 08/26/2019 CLINICAL DATA:  Shortness of breath EXAM: PORTABLE CHEST 1 VIEW COMPARISON:  03/14/2017  FINDINGS: Cardiomegaly. Bilateral lower lobe and perihilar airspace disease. Suspect small right effusion. No pneumothorax or acute bony abnormality. IMPRESSION: Cardiomegaly. Bilateral lower lobe airspace opacities could reflect edema or infection. Suspect small right effusion. Electronically Signed   By: Rolm Baptise M.D.   On: 08/25/2019 16:38    Procedures Procedures (including critical care time)  Medications Ordered in ED Medications  diltiazem (CARDIZEM) 125 mg in dextrose 5% 125 mL (1 mg/mL) infusion (10 mg/hr Intravenous Rate/Dose Change 08/03/2019 1758)  dexamethasone (DECADRON) injection 10 mg (has no administration in time range)  vancomycin (VANCOCIN) IVPB 1000 mg/200 mL premix (0 mg Intravenous Stopped 08/03/2019 1801)  piperacillin-tazobactam (ZOSYN) IVPB 3.375 g (3.375 g Intravenous New Bag/Given 08/08/2019 1734)  diltiazem (CARDIZEM) injection 10 mg (10 mg Intravenous Given 08/26/2019 1626)  diltiazem (CARDIZEM) injection 10 mg (10 mg Intravenous Given 08/06/2019 1731)  furosemide (LASIX) injection 40 mg (40 mg Intravenous Given 08/06/2019 1759)    ED Course  I have reviewed the triage vital signs and the nursing notes.  Pertinent labs & imaging results that were available during my care of the patient were reviewed by me and considered in my medical decision making (see chart for details).    MDM Rules/Calculators/A&P                      CRITICAL CARE Performed by: Milton Ferguson Total critical care time:69minutes Critical care time was exclusive of separately billable procedures and treating other patients. Critical care was necessary to treat or prevent imminent or life-threatening deterioration. Critical care was time spent personally by me on the following activities: development of treatment plan with patient and/or surrogate as well as nursing, discussions with consultants, evaluation of patient's response to treatment, examination of patient, obtaining history from patient or  surrogate, ordering and performing treatments and interventions, ordering and review of laboratory studies, ordering and review of radiographic studies, pulse oximetry and  re-evaluation of patient's condition. Patient with fever and tachycardia and shortness of breath.  Sepsis protocol was started.  Patient also seems to be in congestive heart failure with rapid atrial fib that seems to be controlled some with IV Cardizem.  He will be admitted to medicine Final Clinical Impression(s) / ED Diagnoses Final diagnoses:  Atrial fibrillation with RVR [facility])    Rx / DC Orders ED Discharge Orders    None       Milton Ferguson, MD 08/23/2019 1816

## 2019-08-02 ENCOUNTER — Inpatient Hospital Stay (HOSPITAL_COMMUNITY): Payer: Medicare PPO

## 2019-08-02 ENCOUNTER — Other Ambulatory Visit (HOSPITAL_COMMUNITY): Payer: Medicare PPO

## 2019-08-02 DIAGNOSIS — J80 Acute respiratory distress syndrome: Secondary | ICD-10-CM | POA: Diagnosis present

## 2019-08-02 DIAGNOSIS — N1831 Chronic kidney disease, stage 3a: Secondary | ICD-10-CM

## 2019-08-02 DIAGNOSIS — J449 Chronic obstructive pulmonary disease, unspecified: Secondary | ICD-10-CM

## 2019-08-02 DIAGNOSIS — J9601 Acute respiratory failure with hypoxia: Secondary | ICD-10-CM

## 2019-08-02 DIAGNOSIS — I469 Cardiac arrest, cause unspecified: Secondary | ICD-10-CM

## 2019-08-02 DIAGNOSIS — I4891 Unspecified atrial fibrillation: Secondary | ICD-10-CM

## 2019-08-02 DIAGNOSIS — J1282 Pneumonia due to coronavirus disease 2019: Secondary | ICD-10-CM

## 2019-08-02 DIAGNOSIS — U071 COVID-19: Secondary | ICD-10-CM

## 2019-08-02 LAB — CBC WITH DIFFERENTIAL/PLATELET
Abs Immature Granulocytes: 0.06 10*3/uL (ref 0.00–0.07)
Basophils Absolute: 0.1 10*3/uL (ref 0.0–0.1)
Basophils Relative: 0 %
Eosinophils Absolute: 0 10*3/uL (ref 0.0–0.5)
Eosinophils Relative: 0 %
HCT: 53.4 % — ABNORMAL HIGH (ref 39.0–52.0)
Hemoglobin: 17.2 g/dL — ABNORMAL HIGH (ref 13.0–17.0)
Immature Granulocytes: 1 %
Lymphocytes Relative: 24 %
Lymphs Abs: 3 10*3/uL (ref 0.7–4.0)
MCH: 30.8 pg (ref 26.0–34.0)
MCHC: 32.2 g/dL (ref 30.0–36.0)
MCV: 95.5 fL (ref 80.0–100.0)
Monocytes Absolute: 0.4 10*3/uL (ref 0.1–1.0)
Monocytes Relative: 3 %
Neutro Abs: 9.3 10*3/uL — ABNORMAL HIGH (ref 1.7–7.7)
Neutrophils Relative %: 72 %
Platelets: 83 10*3/uL — ABNORMAL LOW (ref 150–400)
RBC: 5.59 MIL/uL (ref 4.22–5.81)
RDW: 15.1 % (ref 11.5–15.5)
WBC: 12.8 10*3/uL — ABNORMAL HIGH (ref 4.0–10.5)
nRBC: 0 % (ref 0.0–0.2)

## 2019-08-02 LAB — POCT I-STAT 7, (LYTES, BLD GAS, ICA,H+H)
Acid-base deficit: 4 mmol/L — ABNORMAL HIGH (ref 0.0–2.0)
Bicarbonate: 24.9 mmol/L (ref 20.0–28.0)
Calcium, Ion: 1.16 mmol/L (ref 1.15–1.40)
HCT: 50 % (ref 39.0–52.0)
Hemoglobin: 17 g/dL (ref 13.0–17.0)
O2 Saturation: 89 %
Patient temperature: 98.6
Potassium: 4.5 mmol/L (ref 3.5–5.1)
Sodium: 133 mmol/L — ABNORMAL LOW (ref 135–145)
TCO2: 27 mmol/L (ref 22–32)
pCO2 arterial: 61.2 mmHg — ABNORMAL HIGH (ref 32.0–48.0)
pH, Arterial: 7.217 — ABNORMAL LOW (ref 7.350–7.450)
pO2, Arterial: 69 mmHg — ABNORMAL LOW (ref 83.0–108.0)

## 2019-08-02 LAB — BLOOD GAS, ARTERIAL
Acid-Base Excess: 1.5 mmol/L (ref 0.0–2.0)
Bicarbonate: 24 mmol/L (ref 20.0–28.0)
Drawn by: 27407
FIO2: 100
O2 Saturation: 91.3 %
Patient temperature: 37.8
pCO2 arterial: 55.4 mmHg — ABNORMAL HIGH (ref 32.0–48.0)
pH, Arterial: 7.31 — ABNORMAL LOW (ref 7.350–7.450)
pO2, Arterial: 65.7 mmHg — ABNORMAL LOW (ref 83.0–108.0)

## 2019-08-02 LAB — COMPREHENSIVE METABOLIC PANEL
ALT: 15 U/L (ref 0–44)
AST: 25 U/L (ref 15–41)
Albumin: 2.1 g/dL — ABNORMAL LOW (ref 3.5–5.0)
Alkaline Phosphatase: 60 U/L (ref 38–126)
Anion gap: 11 (ref 5–15)
BUN: 51 mg/dL — ABNORMAL HIGH (ref 8–23)
CO2: 23 mmol/L (ref 22–32)
Calcium: 7.6 mg/dL — ABNORMAL LOW (ref 8.9–10.3)
Chloride: 96 mmol/L — ABNORMAL LOW (ref 98–111)
Creatinine, Ser: 1.76 mg/dL — ABNORMAL HIGH (ref 0.61–1.24)
GFR calc Af Amer: 44 mL/min — ABNORMAL LOW (ref >60)
GFR calc non Af Amer: 38 mL/min — ABNORMAL LOW (ref >60)
Glucose, Bld: 92 mg/dL (ref 70–99)
Potassium: 5 mmol/L (ref 3.5–5.1)
Sodium: 130 mmol/L — ABNORMAL LOW (ref 135–145)
Total Bilirubin: 1.1 mg/dL (ref 0.3–1.2)
Total Protein: 4.8 g/dL — ABNORMAL LOW (ref 6.5–8.1)

## 2019-08-02 LAB — PROTIME-INR
INR: 1.6 — ABNORMAL HIGH (ref 0.8–1.2)
Prothrombin Time: 19.3 seconds — ABNORMAL HIGH (ref 11.4–15.2)

## 2019-08-02 LAB — CORTISOL: Cortisol, Plasma: 84.8 ug/dL

## 2019-08-02 LAB — HEPARIN LEVEL (UNFRACTIONATED): Heparin Unfractionated: 1.86 IU/mL — ABNORMAL HIGH (ref 0.30–0.70)

## 2019-08-02 LAB — TROPONIN I (HIGH SENSITIVITY): Troponin I (High Sensitivity): 37 ng/L — ABNORMAL HIGH (ref <18)

## 2019-08-02 LAB — GLUCOSE, CAPILLARY
Glucose-Capillary: 70 mg/dL (ref 70–99)
Glucose-Capillary: 88 mg/dL (ref 70–99)
Glucose-Capillary: 85 mg/dL (ref 70–99)

## 2019-08-02 LAB — LACTIC ACID, PLASMA: Lactic Acid, Venous: 2.8 mmol/L (ref 0.5–1.9)

## 2019-08-02 LAB — MRSA PCR SCREENING: MRSA by PCR: NEGATIVE

## 2019-08-02 LAB — BRAIN NATRIURETIC PEPTIDE: B Natriuretic Peptide: 1293 pg/mL — ABNORMAL HIGH (ref 0.0–100.0)

## 2019-08-02 MED ORDER — SODIUM CHLORIDE 0.9 % IV SOLN: INTRAVENOUS | Status: DC

## 2019-08-02 MED ORDER — DEXTROSE 5 % IV SOLN: INTRAVENOUS | Status: DC

## 2019-08-02 MED ORDER — METOPROLOL TARTRATE 5 MG/5ML IV SOLN
5.0000 mg | Freq: Once | INTRAVENOUS | Status: AC
  Administered 2019-08-02: 5 mg via INTRAVENOUS

## 2019-08-02 MED ORDER — CHLORHEXIDINE GLUCONATE CLOTH 2 % EX PADS: 6.0000 | MEDICATED_PAD | Freq: Every day | CUTANEOUS | Status: DC

## 2019-08-02 MED ORDER — CHLORHEXIDINE GLUCONATE 0.12% ORAL RINSE (MEDLINE KIT)
15.0000 mL | Freq: Two times a day (BID) | OROMUCOSAL | Status: DC
  Administered 2019-08-02: 15 mL via OROMUCOSAL

## 2019-08-02 MED ORDER — SODIUM BICARBONATE 8.4 % IV SOLN
INTRAVENOUS | Status: AC | PRN
  Administered 2019-08-02: 50 meq via INTRAVENOUS

## 2019-08-02 MED ORDER — AMIODARONE IV BOLUS ONLY 150 MG/100ML
150.0000 mg | Freq: Once | INTRAVENOUS | Status: AC
  Administered 2019-08-02: 150 mg via INTRAVENOUS

## 2019-08-02 MED ORDER — AMIODARONE HCL IN DEXTROSE 360-4.14 MG/200ML-% IV SOLN
INTRAVENOUS | Status: AC
  Administered 2019-08-02: 60 mg/h
  Filled 2019-08-02: qty 200

## 2019-08-02 MED ORDER — HEPARIN (PORCINE) 25000 UT/250ML-% IV SOLN: 1400.0000 [IU]/h | INTRAVENOUS | Status: DC

## 2019-08-02 MED ORDER — LORAZEPAM 2 MG/ML IJ SOLN: 2.0000 mg | INTRAMUSCULAR | Status: DC | PRN

## 2019-08-02 MED ORDER — FENTANYL CITRATE (PF) 100 MCG/2ML IJ SOLN
50.0000 ug | Freq: Once | INTRAMUSCULAR | Status: AC
  Administered 2019-08-02: 50 ug via INTRAVENOUS
  Filled 2019-08-02: qty 2

## 2019-08-02 MED ORDER — MIDAZOLAM HCL 2 MG/2ML IJ SOLN: 1.0000 mg | INTRAMUSCULAR | Status: DC | PRN

## 2019-08-02 MED ORDER — MORPHINE SULFATE (PF) 2 MG/ML IV SOLN
2.0000 mg | INTRAVENOUS | Status: DC | PRN
  Administered 2019-08-02: 2 mg via INTRAVENOUS
  Filled 2019-08-02: qty 1

## 2019-08-02 MED ORDER — DIPHENHYDRAMINE HCL 50 MG/ML IJ SOLN: 25.0000 mg | INTRAMUSCULAR | Status: DC | PRN

## 2019-08-02 MED ORDER — SODIUM CHLORIDE 0.9 % IV BOLUS
1000.0000 mL | Freq: Once | INTRAVENOUS | Status: AC
  Administered 2019-08-02: 1000 mL via INTRAVENOUS

## 2019-08-02 MED ORDER — FENTANYL 2500MCG IN NS 250ML (10MCG/ML) PREMIX INFUSION
25.0000 ug/h | INTRAVENOUS | Status: DC
  Administered 2019-08-02: 50 ug/h via INTRAVENOUS
  Filled 2019-08-02: qty 250

## 2019-08-02 MED ORDER — FENTANYL BOLUS VIA INFUSION
25.0000 ug | INTRAVENOUS | Status: DC | PRN
  Filled 2019-08-02: qty 25

## 2019-08-02 MED ORDER — PROPOFOL 1000 MG/100ML IV EMUL: 0.0000 ug/kg/min | INTRAVENOUS | Status: DC

## 2019-08-02 MED ORDER — BUDESONIDE 0.5 MG/2ML IN SUSP
0.5000 mg | Freq: Two times a day (BID) | RESPIRATORY_TRACT | Status: DC
  Administered 2019-08-02: 0.5 mg via RESPIRATORY_TRACT
  Filled 2019-08-02: qty 2

## 2019-08-02 MED ORDER — NOREPINEPHRINE 16 MG/250ML-% IV SOLN
0.0000 ug/min | INTRAVENOUS | Status: DC
  Filled 2019-08-02: qty 250

## 2019-08-02 MED ORDER — ASPIRIN 300 MG RE SUPP
300.0000 mg | RECTAL | Status: AC
  Administered 2019-08-02: 300 mg via RECTAL
  Filled 2019-08-02: qty 1

## 2019-08-02 MED ORDER — NOREPINEPHRINE 4 MG/250ML-% IV SOLN
0.0000 ug/min | INTRAVENOUS | Status: DC
  Administered 2019-08-02: 40 ug/min via INTRAVENOUS
  Administered 2019-08-02: 2 ug/min via INTRAVENOUS
  Administered 2019-08-02: 40 ug/min via INTRAVENOUS
  Administered 2019-08-02: 25 ug/min via INTRAVENOUS
  Filled 2019-08-02 (×2): qty 250
  Filled 2019-08-02: qty 500

## 2019-08-02 MED ORDER — EPINEPHRINE 1 MG/10ML IJ SOSY
PREFILLED_SYRINGE | INTRAMUSCULAR | Status: AC | PRN
  Administered 2019-08-02: 0.1 mg via INTRAVENOUS

## 2019-08-02 MED ORDER — DEXTROSE 50 % IV SOLN
INTRAVENOUS | Status: AC
  Administered 2019-08-02: 25 mL via INTRAVENOUS
  Filled 2019-08-02: qty 50

## 2019-08-02 MED ORDER — PROPOFOL 1000 MG/100ML IV EMUL
INTRAVENOUS | Status: AC
  Filled 2019-08-02: qty 100

## 2019-08-02 MED ORDER — MIDAZOLAM HCL 2 MG/2ML IJ SOLN
1.0000 mg | INTRAMUSCULAR | Status: DC | PRN
  Administered 2019-08-02: 1 mg via INTRAVENOUS
  Filled 2019-08-02 (×2): qty 2

## 2019-08-02 MED ORDER — ROCURONIUM BROMIDE 50 MG/5ML IV SOLN
100.0000 mg | Freq: Once | INTRAVENOUS | Status: AC
  Administered 2019-08-02: 100 mg via INTRAVENOUS
  Filled 2019-08-02: qty 10

## 2019-08-02 MED ORDER — ACETAMINOPHEN 650 MG RE SUPP: 650.0000 mg | Freq: Four times a day (QID) | RECTAL | Status: DC | PRN

## 2019-08-02 MED ORDER — GLYCOPYRROLATE 0.2 MG/ML IJ SOLN: 0.2000 mg | INTRAMUSCULAR | Status: DC | PRN

## 2019-08-02 MED ORDER — VASOPRESSIN 20 UNIT/ML IV SOLN
0.0300 [IU]/min | INTRAVENOUS | Status: DC
  Administered 2019-08-02: 0.03 [IU]/min via INTRAVENOUS
  Filled 2019-08-02: qty 2

## 2019-08-02 MED ORDER — ORAL CARE MOUTH RINSE
15.0000 mL | OROMUCOSAL | Status: DC
  Administered 2019-08-02: 15 mL via OROMUCOSAL

## 2019-08-02 MED ORDER — ACETAMINOPHEN 325 MG PO TABS: 650.0000 mg | ORAL_TABLET | Freq: Four times a day (QID) | ORAL | Status: DC | PRN

## 2019-08-02 MED ORDER — DEXTROSE 50 % IV SOLN: 25.0000 mL | Freq: Once | INTRAVENOUS | Status: AC

## 2019-08-02 MED ORDER — POLYVINYL ALCOHOL 1.4 % OP SOLN
1.0000 [drp] | Freq: Four times a day (QID) | OPHTHALMIC | Status: DC | PRN
  Filled 2019-08-02: qty 15

## 2019-08-02 MED ORDER — ETOMIDATE 2 MG/ML IV SOLN
20.0000 mg | Freq: Once | INTRAVENOUS | Status: AC
  Administered 2019-08-02: 20 mg via INTRAVENOUS

## 2019-08-02 MED ORDER — INSULIN ASPART 100 UNIT/ML ~~LOC~~ SOLN: 0.0000 [IU] | SUBCUTANEOUS | Status: DC

## 2019-08-02 MED ORDER — ALBUMIN HUMAN 5 % IV SOLN
12.5000 g | Freq: Once | INTRAVENOUS | Status: AC
  Administered 2019-08-02: 12.5 g via INTRAVENOUS
  Filled 2019-08-02: qty 500

## 2019-08-02 MED ORDER — PROPOFOL 1000 MG/100ML IV EMUL: 5.0000 ug/kg/min | INTRAVENOUS | Status: DC

## 2019-08-02 MED ORDER — PANTOPRAZOLE SODIUM 40 MG IV SOLR: 40.0000 mg | Freq: Every day | INTRAVENOUS | Status: DC

## 2019-08-02 MED ORDER — SODIUM CHLORIDE 0.9 % IV SOLN: 250.0000 mL | INTRAVENOUS | Status: DC

## 2019-08-02 MED ORDER — IPRATROPIUM-ALBUTEROL 0.5-2.5 (3) MG/3ML IN SOLN
3.0000 mL | Freq: Four times a day (QID) | RESPIRATORY_TRACT | Status: DC
  Administered 2019-08-02: 3 mL via RESPIRATORY_TRACT
  Filled 2019-08-02: qty 3

## 2019-08-02 MED ORDER — METOPROLOL TARTRATE 5 MG/5ML IV SOLN
INTRAVENOUS | Status: AC
  Filled 2019-08-02: qty 5

## 2019-08-02 MED ORDER — GLYCOPYRROLATE 1 MG PO TABS: 1.0000 mg | ORAL_TABLET | ORAL | Status: DC | PRN

## 2019-08-03 LAB — URINE CULTURE: Culture: NO GROWTH

## 2019-08-03 LAB — CULTURE, BLOOD (ROUTINE X 2): Special Requests: ADEQUATE

## 2019-08-06 LAB — CULTURE, BLOOD (ROUTINE X 2)
Culture: NO GROWTH
Special Requests: ADEQUATE

## 2019-08-06 MED FILL — Medication: Qty: 1 | Status: AC

## 2019-08-30 NOTE — Progress Notes (Addendum)
Patient's respiratory status worsened in the ED.  I called and discussed with patient's wife that patient needs imminent intubation and mechanical ventilation, and after she discussed with patient's daughter, she called back and told that patient should be intubated.  Discussed Dr. Wyvonnia Dusky,  patient was intubated, had a brief episode of cardiorespiratory arrest required 3 minutes of CPR.  Patient will be transferred to ICU at [facility] and discussed with Dr. Madalyn Rob, CCM who has accepted the patient.

## 2019-08-30 NOTE — Progress Notes (Signed)
ANTICOAGULATION CONSULT NOTE - Initial Consult  Pharmacy Consult for heparin Indication: atrial fibrillation  Allergies  Allergen Reactions  . Statins Itching    Patient Measurements: Height: '5\' 9"'$  (175.3 cm) Weight: 209 lb 7 oz (95 kg) IBW/kg (Calculated) : 70.7 Heparin Dosing Weight: 90kg  Vital Signs: Temp: 98.8 F (37.1 C) (03/04 0540) Temp Source: Oral (03/04 0540) BP: 77/56 (03/04 0630) Pulse Rate: 110 (03/04 0630)  Labs: Recent Labs    08/20/2019 1615 08/29/2019 1615 08/03/2019 1815 08/24/2019 2240 08-24-2019 0228 24-Aug-2019 0611  HGB 16.9   < >  --   --  17.2* 17.0  HCT 51.8  --   --   --  53.4* 50.0  PLT 101*  --   --   --  83*  --   APTT 33  --   --   --   --   --   LABPROT 19.2*  --   --   --  19.3*  --   INR 1.6*  --   --   --  1.6*  --   CREATININE 1.44*  --   --   --  1.76*  --   TROPONINIHS  --   --  29* 32* 37*  --    < > = values in this interval not displayed.    Estimated Creatinine Clearance: 43.8 mL/min (A) (by C-G formula based on SCr of 1.76 mg/dL (H)).   Medical History: Past Medical History:  Diagnosis Date  . Asthma   . Borderline diabetic   . Cataract    starting to form per pt per eye md  . COPD (chronic obstructive pulmonary disease) ([place])   . Gout    pt denies  . High cholesterol   . Hypertension   . Leukocytosis   . Polycythemia   . TIA (transient ischemic attack) 20 y ago    Medications:  Medications Prior to Admission  Medication Sig Dispense Refill Last Dose  . allopurinol (ZYLOPRIM) 100 MG tablet Take 200 mg by mouth in the morning.    07/31/2019 at Unknown time  . amLODipine-benazepril (LOTREL) 10-40 MG capsule Take 1 capsule by mouth in the morning.    07/31/2019 at Unknown time  . ezetimibe (ZETIA) 10 MG tablet Take 10 mg by mouth every evening.    07/31/2019 at Unknown time  . furosemide (LASIX) 20 MG tablet Take 20 mg by mouth in the morning.    07/31/2019 at Unknown time  . ipratropium-albuterol (DUONEB) 0.5-2.5 (3) MG/3ML  SOLN Inhale 1.5-3 mLs into the lungs every 6 (six) hours as needed (for shortness of breath).    07/31/2019 at Unknown time  . loratadine (CLARITIN) 10 MG tablet Take 10 mg by mouth every evening.    07/31/2019 at Unknown time  . metoprolol succinate (TOPROL-XL) 100 MG 24 hr tablet Take 100 mg by mouth 2 (two) times daily. Take with or immediately following a meal.   07/31/2019 at 1300  . tiotropium (SPIRIVA) 18 MCG inhalation capsule Place 18 mcg into inhaler and inhale daily.   07/31/2019 at Unknown time  . XARELTO 20 MG TABS tablet Take 20 mg by mouth in the morning.    07/31/2019 at 1300   Scheduled:  . allopurinol  200 mg Oral Daily  . vitamin C  500 mg Oral Daily  . budesonide (PULMICORT) nebulizer solution  0.5 mg Nebulization BID  . chlorhexidine gluconate (MEDLINE KIT)  15 mL Mouth Rinse BID  . Chlorhexidine Gluconate Cloth  6 each Topical Daily  . dexamethasone (DECADRON) injection  6 mg Intravenous Q24H  . furosemide  20 mg Intravenous Q12H  . ipratropium-albuterol  3 mL Nebulization Q6H  . loratadine  10 mg Oral QPM  . mouth rinse  15 mL Mouth Rinse 10 times per day  . metoprolol succinate  100 mg Oral BID  . pantoprazole (PROTONIX) IV  40 mg Intravenous QHS  . umeclidinium bromide  1 puff Inhalation Daily  . zinc sulfate  220 mg Oral Daily   Infusions:  . sodium chloride Stopped (03-Aug-2019 0316)  . sodium chloride Stopped (2019/08/03 0300)  . albumin human    . diltiazem (CARDIZEM) infusion 5 mg/hr (August 03, 2019 0610)  . fentaNYL infusion INTRAVENOUS 50 mcg/hr (08/03/19 0612)  . norepinephrine (LEVOPHED) Adult infusion 40 mcg/min (08/03/19 0647)  . remdesivir 100 mg in NS 100 mL    . vasopressin (PITRESSIN) infusion - *FOR SHOCK*      Assessment: 74yo male admitted to [facility] for Covid PNA > req'd intubation > had brief arrest req'ing 48mn of CPR, [facility] for Afib; on Xarelto PTA with last dose 3/2.  Goal of Therapy:  Heparin level 0.3-0.7 units/ml aPTT  66-102 seconds Monitor platelets by anticoagulation protocol: Yes   Plan:  Will start heparin gtt at 1400 units/hr and monitor heparin levels, aPTT (while Xarelto affects anti-Xa), and CBC.  VWynona Neat PharmD, BCPS  303/05/217:13 AM

## 2019-08-30 NOTE — Procedures (Signed)
Extubation Procedure Note  Patient Details:   Name: John Mcgrath DOB: July 26, 1945 MRN: [id number]   Airway Documentation:    Vent end date: 08-05-2019 Vent end time: 1342   Evaluation  O2 sats: currently acceptable Complications: No apparent complications Patient did tolerate procedure well. Bilateral Breath Sounds: Diminished, Rhonchi   No  Pt terminally extubated per MD order.  Pt is a DNR.  RN at bedside as well.  RT will continue to monitor.  Pierre Bali 08/05/19, 1:47 PM

## 2019-08-30 NOTE — ED Notes (Signed)
Critical care paged to Dr Wyvonnia Dusky

## 2019-08-30 NOTE — Discharge Summary (Signed)
DEATH SUMMARY   Patient Details  Name: John Mcgrath MRN: [id number] DOB: 10/24/1945  Admission/Discharge Information   Admit Date:  08-06-2019  Date of Death: Date of Death: 08-07-19  Time of Death: Time of Death: August 27, 1403  Length of Stay: 1  Referring Physician: Patient, No Pcp Per   Reason(s) for Hospitalization  Patient was admitted with complaints of fatigue, poor appetite Tested positive for Covid, developed acute respiratory failure. Was intubated, during his initial evaluation for intubation, he sustained a PEA arrest Admitted and transferred to the ICU  Diagnoses  Preliminary cause of death:  Secondary Diagnoses (including complications and co-morbidities):  Principal Problem:   Pneumonia due to COVID-19 virus Active Problems:   Chronic obstructive pulmonary disease (HCC)   Chronic lymphocytic leukemia (HCC)   Thrombocytopenia, immune (HCC)   CKD (chronic kidney disease) stage 3, GFR 30-59 ml/min   Atrial fibrillation with RVR ([place])   Acute respiratory failure with hypoxia (HCC)   Acute respiratory distress syndrome (ARDS) due to COVID-19 virus [facility])   Cardiac arrest [facility])   Brief Hospital Course (including significant findings, care, treatment, and services provided and events leading to death)  John Mcgrath is a 74 y.o. 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  Respiratory failure. EXAM: CT HEAD WITHOUT CONTRAST TECHNIQUE: Contiguous axial images were obtained from the base of the skull through the vertex without intravenous contrast. COMPARISON:  None. FINDINGS: Brain: Generalized brain atrophy. 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 Chronic small-vessel ischemic changes of the cerebral hemispheric white matter. No sign of acute infarction, mass lesion, hemorrhage, hydrocephalus or extra-axial collection. No generalized brain swelling. Vascular: There is atherosclerotic calcification of the major vessels at the base of the brain. Skull: Negative Sinuses/Orbits: Clear/normal Other: None IMPRESSION: No acute finding. Generalized atrophy. Chronic small-vessel ischemic changes of the white matter. 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 Electronically Signed   By: Nelson Chimes M.D.   On: 2019/08/07 03:57   DG Chest Portable 1 View  Result Date: 08/07/19 CLINICAL DATA:  History of COVID-19 positivity with recent cardiac arrest EXAM: PORTABLE CHEST 1 VIEW COMPARISON:  08/06/18 FINDINGS: Cardiac shadow is enlarged but stable. Aortic calcifications are noted. Endotracheal tube and gastric catheter are noted in satisfactory position new from the prior exam. Right-sided pleural effusion is again noted. Patchy opacities are again identified right greater than left consistent with the given clinical history of COVID-19 positivity. IMPRESSION: Overall stable appearance of the chest when compare with the prior exam. New endotracheal tube and gastric catheter are seen. Electronically Signed   By: Inez Catalina M.D.   On: Aug 07, 2019 02:43   DG Chest Port 1 View  Result Date: 08/06/19 CLINICAL DATA:  Shortness of breath EXAM: PORTABLE CHEST 1 VIEW COMPARISON:  03/14/2017 FINDINGS: Cardiomegaly. Bilateral lower lobe and perihilar airspace disease. Suspect small right effusion. No pneumothorax or acute bony  abnormality. IMPRESSION: Cardiomegaly. Bilateral lower lobe  airspace opacities could reflect edema or infection. Suspect small right effusion. Electronically Signed   By: Rolm Baptise M.D.   On: 08/27/2019 16:38    Microbiology Recent Results (from the past 240 hour(s))  Blood Culture (routine x 2)     Status: None (Preliminary result)   Collection Time: 08/16/2019  4:15 PM   Specimen: Right Antecubital; Blood  Result Value Ref Range Status   Specimen Description RIGHT ANTECUBITAL  Final   Special Requests   Final    BOTTLES DRAWN AEROBIC AND ANAEROBIC Blood Culture adequate volume   Culture   Final    NO GROWTH 2 DAYS Performed at [facility], [address], [place], [place] [address]    Report Status PENDING  Incomplete  Urine culture     Status: None   Collection Time: 08/29/2019  4:15 PM   Specimen: In/Out Cath Urine  Result Value Ref Range Status   Specimen Description   Final    IN/OUT CATH URINE Performed at [facility], [address], [place], [place] [address]    Special Requests   Final    NONE Performed at [facility], [address] [place], [place] [address]    Culture   Final    NO GROWTH Performed at [facility], [place] [address], [place], [place] [address]    Report Status 08/03/2019 FINAL  Final  Blood Culture (routine x 2)     Status: Abnormal   Collection Time: 08/29/2019  4:31 PM   Specimen: Left Antecubital; Blood  Result Value Ref Range Status   Specimen Description   Final    LEFT ANTECUBITAL Performed at [facility], [address], [place], [place] [address]    Special Requests   Final    BOTTLES DRAWN AEROBIC AND ANAEROBIC Blood Culture adequate volume Performed at [facility], [address], [place], [place] [address]    Culture  Setup Time   Final    GRAM POSITIVE COCCI Gram Stain Report Called to,Read Back By and Verified With: SHELTON P. AT [place] [id number] BY THOMPSON S. Performed at [place] Performed at [facility], [address], [place], [place] [address]    Culture (A)  Final    STAPHYLOCOCCUS SPECIES (COAGULASE NEGATIVE) THE SIGNIFICANCE OF ISOLATING THIS ORGANISM FROM A SINGLE SET OF BLOOD CULTURES WHEN MULTIPLE SETS ARE DRAWN IS UNCERTAIN. PLEASE NOTIFY THE MICROBIOLOGY DEPARTMENT WITHIN ONE WEEK IF SPECIATION AND SENSITIVITIES ARE REQUIRED. Performed at [facility], [place] [address], [place], [place] [address]    Report Status 08/03/2019 FINAL  Final  MRSA PCR Screening     Status: None   Collection Time: August 05, 2019  5:55 AM   Specimen: Nasal Mucosa; Nasopharyngeal  Result Value Ref Range Status   MRSA by PCR NEGATIVE NEGATIVE Final    Comment:        The GeneXpert MRSA Assay (FDA approved for NASAL specimens only), is one component of a comprehensive MRSA colonization surveillance program. It is not intended to diagnose MRSA infection nor to guide or monitor treatment for MRSA infections. Performed at [facility], [place] [address] [place], [place] [address]     Lab Basic Metabolic Panel: Recent Labs  Lab 08/15/2019 1615 07/31/2019 1815 05-Aug-2019 0228 08/05/2019 0611  NA 132*  --  130* 133*  K 5.2*  --  5.0 4.5  CL 95*  --  96*  --   CO2 28  --  23  --   GLUCOSE 112*  --  92  --   BUN 45*  --  51*  --   CREATININE 1.44*  --  1.76*  --   CALCIUM 8.1*  --  7.6*  --   MG  --  1.8  --   --    Liver Function Tests: Recent Labs  Lab 08/12/2019 1615 08/20/2019 0228  AST 17 25  ALT 14 15  ALKPHOS 70 60  BILITOT 1.3* 1.1  PROT 5.4* 4.8*  ALBUMIN 2.6* 2.1*   No results for input(s): LIPASE, AMYLASE in the last 168 hours. No results for input(s): AMMONIA in the last 168 hours. CBC: Recent Labs  Lab 08/04/2019 1615 08-20-2019 0228 August 20, 2019 0611  WBC 11.3* 12.8*  --   NEUTROABS 8.5* 9.3*  --   HGB 16.9 17.2* 17.0  HCT 51.8 53.4* 50.0  MCV 93.8 95.5  --   PLT 101* 83*  --    Cardiac Enzymes: No results for input(s): CKTOTAL, CKMB,  CKMBINDEX, TROPONINI in the last 168 hours. Sepsis Labs: Recent Labs  Lab 08/24/2019 1615 08/03/2019 1815 20-Aug-2019 0228  PROCALCITON  --  0.24  --   WBC 11.3*  --  12.8*  LATICACIDVEN 1.4 1.1 2.8*    Procedures/Operations     Tasheba Henson A Briseidy Spark 08/03/2019, 4:51 PM

## 2019-08-30 NOTE — ED Provider Notes (Signed)
ED Course/Procedures    Patient holding the ED for admission.  He has a history of COPD, Covid infection, CHF, admitted to the hospital.  He is tachycardic in the 150s.  He is tachypneic in the 30s.  His O2 saturations are fluctuating in the 80s on a nonrebreather with an unrealiable waveform. ABG ordered.  Asked to evaluate for intubation by the hospitalist Dr. Darrick Meigs.  Patient is adamant that he does not want to be placed on the ventilator.  I discussed that there is a strong possibility he could die without this.  Patient appears to have capacity to make medical decisions.  I spoke with his wife Inez Catalina who confirms that he does not want to be on a ventilator in the past.  We will check ABG.  Discussed again with the patient that he will likely die without intubation.  He is hypoxic and tachycardic and tachypneic with respirations in the 30s.  He does eventually agree to intubation.  Intubation was performed without difficulty on single pass. Patient did have brief arrest after intubation with loss of pulses.  He received CPR and 1 dose of epinephrine before return of spontaneous circulation. Suspect arrest was due to hypoxia. PEA arrest. ABG without significant hypercarbia.   Cardioversion was attempted due to his rapid A. fib and hypotension.  This was not successful and patient remained in A. Fib.  Patient's wife Inez Catalina updated by phone.  Discussed with hospitalist Dr. Darrick Meigs as well as Dr. Oletta Darter.  Patient accepted to [facility].  Obtain CT head given his history of Xarelto use.  Initiate cooling protocol.  CT head is negative.  Patient is given hydration started on peripheral Levophed for blood pressure was in the 80s. Repeat chest x-ray shows good position of endotracheal tube and NG tube.  Dr. Darrick Meigs has arranged for ICU transfer to [facility].  Procedure Name: Intubation Date/Time: 22-Aug-2019 2:30 AM Performed by: Ezequiel Essex, MD Pre-anesthesia Checklist: Patient  identified, Patient being monitored, Emergency Drugs available, Timeout performed and Suction available Oxygen Delivery Method: Non-rebreather mask Preoxygenation: Pre-oxygenation with 100% oxygen Induction Type: Rapid sequence and IV induction Ventilation: Mask ventilation without difficulty Laryngoscope Size: Glidescope and 4 Grade View: Grade I Tube size: 7.5 mm Number of attempts: 1 Airway Equipment and Method: Video-laryngoscopy,  Bougie stylet and Rigid stylet Placement Confirmation: ETT inserted through vocal cords under direct vision,  CO2 detector and Breath sounds checked- equal and bilateral Secured at: 24 cm Tube secured with: ETT holder Dental Injury: Teeth and Oropharynx as per pre-operative assessment  Difficulty Due To: Difficulty was anticipated Future Recommendations: Recommend- induction with short-acting agent, and alternative techniques readily available    .Cardioversion  Date/Time: 2019/08/22 2:30 AM Performed by: Ezequiel Essex, MD Authorized by: Ezequiel Essex, MD   Consent:    Consent obtained:  Emergent situation Pre-procedure details:    Cardioversion basis:  Emergent   Rhythm:  Atrial fibrillation   Electrode placement:  Anterior-posterior Patient sedated: No Attempt one:    Cardioversion mode:  Synchronous   Waveform:  Biphasic   Shock (Joules):  100   Shock outcome:  No change in rhythm Post-procedure details:    Patient status:  Unresponsive   Patient tolerance of procedure:  Tolerated well, no immediate complications CPR  Date/Time: 08/22/2019 3:06 AM Performed by: Ezequiel Essex, MD Authorized by: Ezequiel Essex, MD  CPR Procedure Details:      Amount of time prior to administration of ACLS/BLS (minutes):  0   ACLS/BLS initiated  by EMS: No     CPR/ACLS performed in the ED: Yes     Duration of CPR (minutes):  5   Outcome: ROSC obtained    CPR performed via ACLS guidelines under my direct supervision.  See RN documentation for  details including defibrillator use, medications, doses and timing. .Critical Care Performed by: Ezequiel Essex, MD Authorized by: Ezequiel Essex, MD   Critical care provider statement:    Critical care time (minutes):  100   Critical care was necessary to treat or prevent imminent or life-threatening deterioration of the following conditions:  Respiratory failure, circulatory failure, cardiac failure and shock   Critical care was time spent personally by me on the following activities:  Discussions with consultants, evaluation of patient's response to treatment, examination of patient, ordering and performing treatments and interventions, ordering and review of laboratory studies, ordering and review of radiographic studies, pulse oximetry, re-evaluation of patient's condition, obtaining history from patient or surrogate and review of old charts    EKG Interpretation  Date/Time:  08/15/19 02:17:44 EST Ventricular Rate:  193 PR Interval:    QRS Duration: 72 QT Interval:  223 QTC Calculation: 400 R Axis:   -32 Text Interpretation: Atrial fibrillation with rapid V-rate Ventricular premature complex Anterior infarct, old No significant change was found Confirmed by Ezequiel Essex [phone]) on 15-Aug-2019 2:33:10 AM            Ezequiel Essex, MD August 15, 2019 0422

## 2019-08-30 NOTE — ED Notes (Signed)
MD notified of urinary retention, foley placement ordered

## 2019-08-30 NOTE — ED Notes (Signed)
Date and time results received: 2019-08-10 0305  Test: Lactic Acid Critical Value: 2.8  Name of Provider Notified: Rancour  Orders Received? Or Actions Taken?: na

## 2019-08-30 NOTE — Progress Notes (Signed)
Pt time of death on August 06, 2019 at 41. Dr. Ander Slade was notified of time of death. Two RN's verified: [place], RN. Wife,   Inez Catalina was updated of patient's time of death. Funeral home is undecided at this time. Patient belongings will be brought to the morgue with the patient's body.

## 2019-08-30 NOTE — ED Notes (Signed)
EDP @ bedside, pt still sedated, pts HR 186bpm & in afib, EDP cardioverted @ 0227, 100 joules, pt still in afib @ 169bpm, 2nd cardioversion @ 0228, 100 joules, pts HR 127bpm & still in afib.

## 2019-08-30 NOTE — H&P (Signed)
NAME:  GILFORD HOCKERSMITH, MRN:  [id number], DOB:  1945-07-23, LOS: 1 ADMISSION DATE:  08/20/2019, CONSULTATION DATE:  3/4 REFERRING MD:  Dr. Wyvonnia Dusky EDP, CHIEF COMPLAINT:  COVID-19  Brief History   82M with multiple medical problems intubated for hypoxia in the setting of COVID-19. Suffered brief arrest in the post intubation period. Transferred to [facility] for admission.   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 He also had a renal mass found in 2019, which was concerning for renal cell carcinoma. He neglected to have any additional workup done on this. He tested positive for COVID-19 virus on 2/17 because other family members had been positive. He did not have symptoms at that time. About 2 days later he began to develop fatigue and poor appetite, which progressed over the following 2 weeks until he couldn't get out of bed and his O2 sat fell to 80% on 3L. Upon arrival to ED he was found to be hypoxic on 4L [place]. Tachycardic to 160 and irregular. COVID test confirmed positive. He was started on empiric antibiotics, remdesivir, and steroids. Admitted to the hospitalist at [facility]. Then, before he was admitted, he began to deteriorate. ER note reports worsening hypoxia. He was initially DNR, but when told he would likely die without intubation, he agreed to be intubated. Brief PEA arrest after admission with ROSC after one round of CPR. He was started on Levophed. Transferred to ICU.   Past Medical History   has a past medical history of Asthma, Borderline diabetic, Cataract, COPD (chronic obstructive pulmonary disease) ([place]), Gout, High cholesterol, Hypertension, Leukocytosis, Polycythemia, and TIA (transient ischemic attack) (20 y ago).    Significant Hospital Events    ICU  Consults:    Procedures:  ETT 3/4>  Significant Diagnostic Tests:  CT head 3/4 > No acute finding. Generalized atrophy. Chronic small-vessel ischemic changes of the white matter. 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  Micro Data:  Blood 3/4 > Sputum 3/4 > Urine 3/4 > Strep urine 3/4 > Legionella urine 3/4 >  Antimicrobials:  Cefepime 3/3 > Vanco 3/3 >  Interim history/subjective:    Objective   Blood pressure (!) 82/67, pulse 98, temperature 100.3 F (37.9 C), temperature source Oral, resp. rate 18, height 5\' 9"  (1.753 m), weight 95 kg, SpO2 (!) 89 %.    Vent Mode: PRVC FiO2 (%):  [100 %] 100 % Set Rate:  [18 bmp] 18 bmp Vt Set:  [560 mL] 560 mL PEEP:  [5 cmH20] 5 cmH20 Plateau Pressure:  [16 cmH20-21 cmH20] 21 cmH20   Intake/Output Summary (Last 24 hours) at 2019-08-16 0551 Last data filed at 07/31/2019 2227 Gross per 24 hour  Intake --  Output 300 ml  Net -300 ml   Filed Weights   08/11/2019 1607  Weight: 95 kg    Examination: General: obese elderly male on vent HENT: [place]/AT, PERRL, no JVD Lungs: bibasilar coarse.  Cardiovascular: IRIR tachy no MRG Abdomen: Soft, non-tender, non-distended Extremities: 3+ pitting edema up to knees bilaterally.  Neuro: Alert, follows commands. Moves all extremity  Resolved Hospital Problem list     Assessment & Plan:   ARDS secondary to COVID-19 infection - ARDS vent protocol. 6cc/kg - High PEEP/FiO2 required.  - Target driving pressure < 15, Plateau pressure <  30 - Remdesivir and dexamethasone.  - Fentanyl infusion for sedation, will likely need to add more once his BP is better supported.  - CXR, ABG  Septic shock: pulmonary source. Rule out bacterial co-infection.  - ICU hemodynamic monitoring -s/p 2 L at AP, and is clearly volume up - Levophed form MAP goal 65 - Levophed need rapidly increased with virtually no response in BP - Add vasopressin shock dose - Albumin  - Echocardiogram - Empiric  antibitoics as above - Follow cultures - assess lactic acid and procalcitonin.   Cardiac arrest: very brief in the peri-intubation period. Awake and following commands upon arrival to [facility].  - Echo - Ongoing neuro exams  COPD on home O2 OSA on BiPAP - Scheduled and PRN bronchodilators - Holding home spiriva  CHF, no echo on file. Acute on chronic - Echo - Unable to diurese due to shock - Holding home amlodipine, lasix, metoprolol  DM: borderline - CBG monitoring  Atrial fibrillation with RVR: on pressors and dilt drip upon arrival to [facility] - DC dilt drip - Can add amio if rate becomes rapid again.  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 He had Pleurex at the time.  Renal mass: discovered on CT in 2019. Had been follows by oncology and was recommended to have MRI. Never followed up.  - Supportive care.   Best practice:  Diet: NPO Pain/Anxiety/Delirium protocol (if indicated): Fentanyl infusion. RASS goal - 4 VAP protocol (if indicated): Per protocol DVT prophylaxis: heparin infusion GI prophylaxis: PPI Glucose control: SSI Mobility: BR Code Status: FULL Family Communication: Wife updated. Has given consent for CVL. DNR should he arrest. Full medical care otherwise.  Disposition: ICU. Critically ill.   Labs   CBC: Recent Labs  Lab 08/24/2019 1615 2019-08-13 0228  WBC 11.3* 12.8*  NEUTROABS 8.5* 9.3*  HGB 16.9 17.2*  HCT 51.8 53.4*  MCV 93.8 95.5  PLT 101* 83*    Basic Metabolic Panel: Recent Labs  Lab 08/06/2019 1615 08/20/2019 1815 Aug 13, 2019 0228  NA 132*  --  130*  K 5.2*  --  5.0  CL 95*  --  96*  CO2 28  --  23  GLUCOSE 112*  --  92  BUN 45*  --  51*  CREATININE 1.44*  --  1.76*  CALCIUM 8.1*  --  7.6*  MG  --  1.8  --    GFR: Estimated Creatinine Clearance: 43.8 mL/min (A) (by C-G  formula based on SCr of 1.76 mg/dL (H)). Recent Labs  Lab 08/09/2019 1615 08/01/2019 1815 2019/08/13 0228  PROCALCITON  --  0.24  --   WBC 11.3*  --  12.8*  LATICACIDVEN 1.4 1.1 2.8*    Liver Function Tests: Recent Labs  Lab 08/01/19 1615 Aug 13, 2019 0228  AST 17 25  ALT 14 15  ALKPHOS 70 60  BILITOT 1.3* 1.1  PROT 5.4* 4.8*  ALBUMIN 2.6* 2.1*   No results for input(s): LIPASE, AMYLASE in the last 168 hours. No results for input(s): AMMONIA in the last 168 hours.  ABG    Component Value Date/Time   PHART 7.310 (L) Aug 13, 2019 0109   PCO2ART 55.4 (H) 08-13-19 0109   PO2ART 65.7 (L) 08-13-2019 0109   HCO3 24.0 Aug 13, 2019 0109   O2SAT 91.3 2019-08-13 0109     Coagulation Profile: Recent Labs  Lab 08/11/2019 1615 08/04/2019 0228  INR 1.6* 1.6*    Cardiac Enzymes: No results for input(s): CKTOTAL, CKMB, CKMBINDEX, TROPONINI in the last 168 hours.  HbA1C: No results found for: HGBA1C  CBG: No results for input(s): GLUCAP in the last 168 hours.  Review of Systems:   Unable as patient is encephalopathic and intubated.   Past Medical History  He,  has a past medical history of Asthma, Borderline diabetic, Cataract, COPD (chronic obstructive pulmonary disease) ([place]), Gout, High cholesterol, Hypertension, Leukocytosis, Polycythemia, and TIA (transient ischemic attack) (20 y ago).   Surgical History    Past Surgical History:  Procedure Laterality Date  . HERNIA REPAIR  @ 74 years of age & then again @ 74 years of age     Social History   reports that he has been smoking. He has a 50.00 pack-year smoking history. He has never used smokeless tobacco. He reports current alcohol use of about 21.0 standard drinks of alcohol per week. He reports that he does not use drugs.   Family History   His family history includes Obesity in his daughter.   Allergies Allergies  Allergen Reactions  . Statins Itching     Home Medications  Prior to Admission medications   Medication  Sig Start Date End Date Taking? Authorizing Provider  allopurinol (ZYLOPRIM) 100 MG tablet Take 200 mg by mouth in the morning.    Yes [provider]  amLODipine-benazepril (LOTREL) 10-40 MG capsule Take 1 capsule by mouth in the morning.  04/30/19  Yes [provider]  ezetimibe (ZETIA) 10 MG tablet Take 10 mg by mouth every evening.  05/21/19  Yes [provider]  furosemide (LASIX) 20 MG tablet Take 20 mg by mouth in the morning.  11/06/15  Yes [provider]  ipratropium-albuterol (DUONEB) 0.5-2.5 (3) MG/3ML SOLN Inhale 1.5-3 mLs into the lungs every 6 (six) hours as needed (for shortness of breath).  10/02/15  Yes [provider]  loratadine (CLARITIN) 10 MG tablet Take 10 mg by mouth every evening.    Yes [provider]  metoprolol succinate (TOPROL-XL) 100 MG 24 hr tablet Take 100 mg by mouth 2 (two) times daily. Take with or immediately following a meal.   Yes [provider]  tiotropium (SPIRIVA) 18 MCG inhalation capsule Place 18 mcg into inhaler and inhale daily.   Yes [provider]  XARELTO 20 MG TABS tablet Take 20 mg by mouth in the morning.  10/06/15  Yes [provider]     Critical care time: 60 mins     Georgann Housekeeper, AGACNP-BC [place] for personal pager PCCM on call pager [phone]  08/04/2019 7:19 AM

## 2019-08-30 NOTE — Progress Notes (Addendum)
Pt transported to and from CT on vent.

## 2019-08-30 NOTE — Progress Notes (Signed)
Nutrition Brief Note  Chart reviewed. Pt NPO, on vent support.  Plan to  transition to comfort care.  No  nutrition interventions warranted at this time.  Please re-consult as needed.   Kerman Passey MS, RDN, LDN, CNSC RD Pager Number and Weekend/On-Call After Hours Pager Located in [place]

## 2019-08-30 NOTE — Progress Notes (Addendum)
On arrival pt still on 100% FIO2; high PEEP. Now w/ AF and RVR, I gave him a bolus of amiodarone.  Nursing staff shared w/ me that the patient and his wife had indicated that we may rapidly be moving past the level of support which he would be ok with undergoing.   I offered two options. Option 1) we continue aggressive support for a time frame of 48 to 72 hours to see if we can improve him. That would include-->heavy sedation, neuromuscular blockade and prone therapy. My experience w/ this from [facility] has been that patients in this situation in his age group and his co-morbidities do not survive COVID PNA when it advances to respiratory failure, requiring mechanical ventilation and high level of support.  Option 2) no further escalation. Family to come in and say "good bye", then comfort care w/ one way extubation.   Based on our discussion his wife Inez Catalina has chosen to transition to comfort care.  Plan We will up-titrate sedation Family will come to bedside to see patient Will dc all therapy that does not contribute to comfort Will extubate after family has opportunity to see the patient.     Erick Colace ACNP-BC [place] Pager # [phone] OR # [phone] if no answer  Events noted Patient seen, examined, Plan is comfort measures Sherrilyn Rist, MD [place], PCCM Cell: [phone]

## 2019-08-30 NOTE — ED Notes (Signed)
CODE BLUE Called at [employer]

## 2019-08-30 NOTE — ED Notes (Signed)
Intubation @ 0205, lost pulses @ 0206, code cart retrieved, pt placed on defib pads, epi given @ 0208, bicarb @ 0210, ROSC @ 0211. EDP verified w/US. Respiratory & 3 RNs @ bedside.

## 2019-08-30 DEATH — deceased

## 2021-09-19 IMAGING — DX DG CHEST 1V PORT
2 series · 2 of 2 positions shown · non-contrast
Comparison: 03/14/2017

CLINICAL DATA: Shortness of breath

EXAM:
PORTABLE CHEST 1 VIEW

[chest ap (1 of 2)]
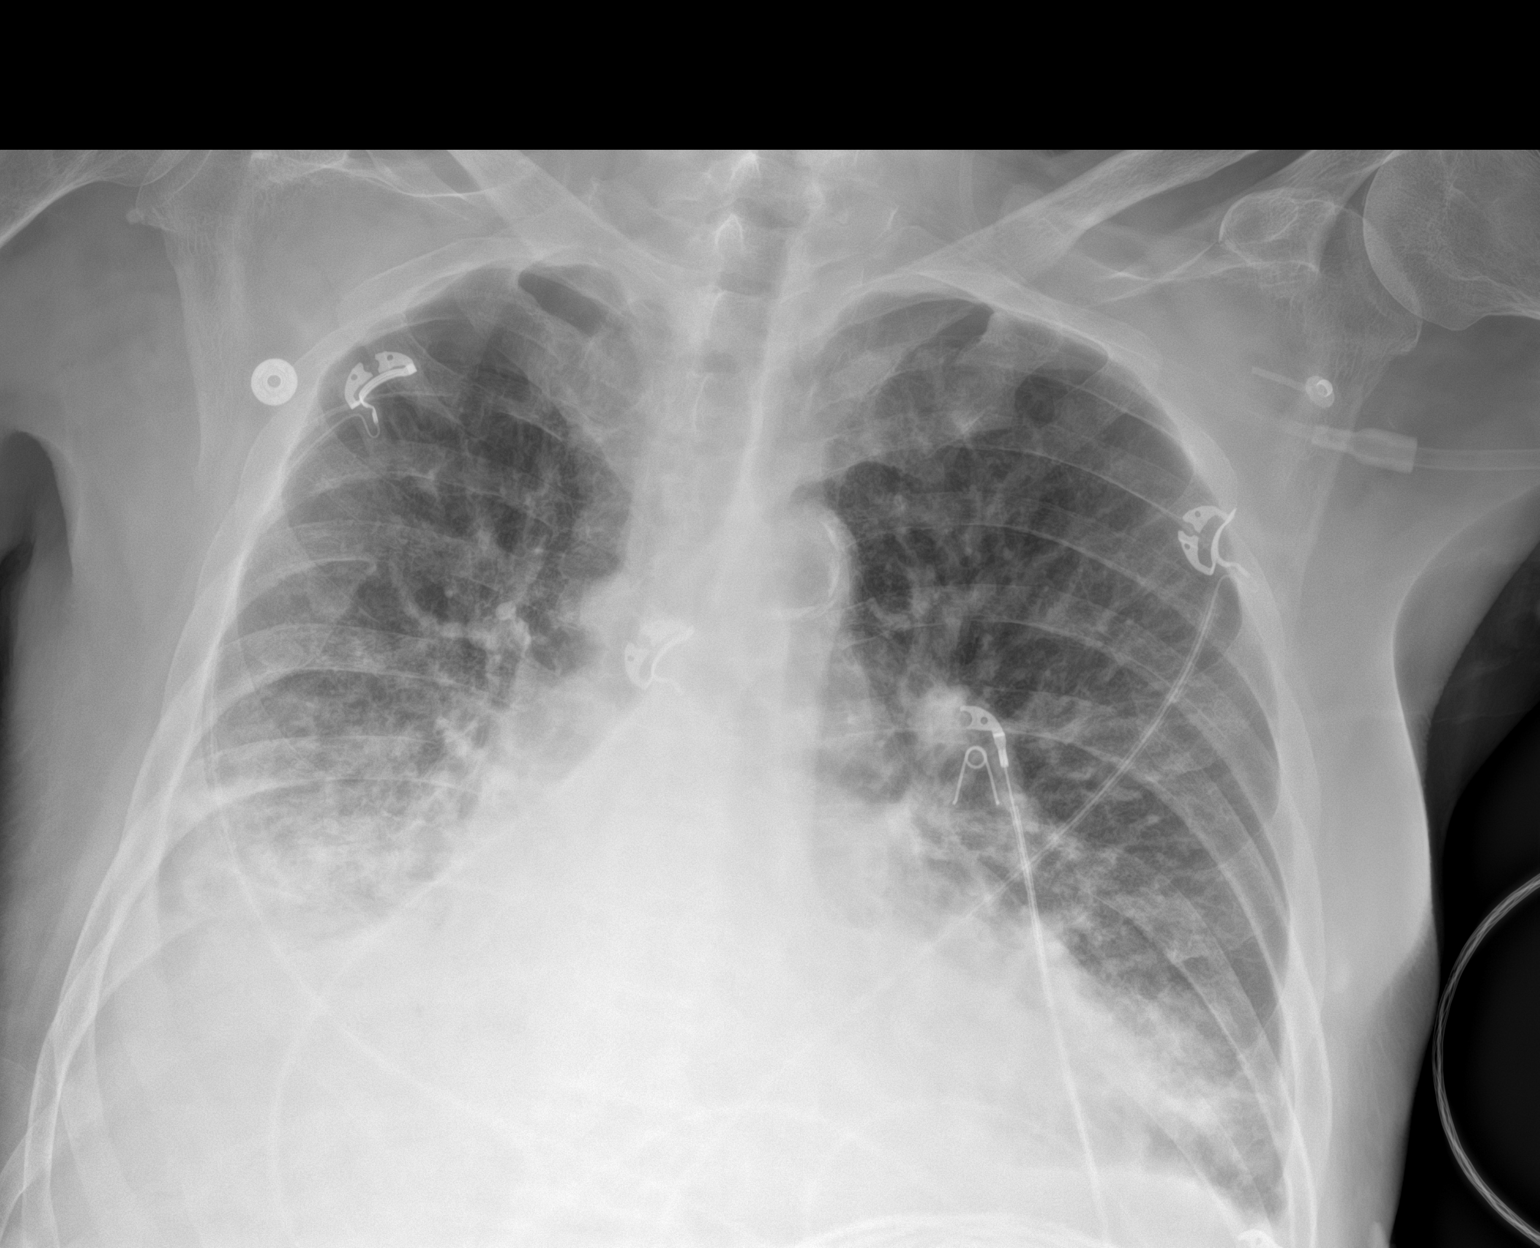

[chest ap (2 of 2)]
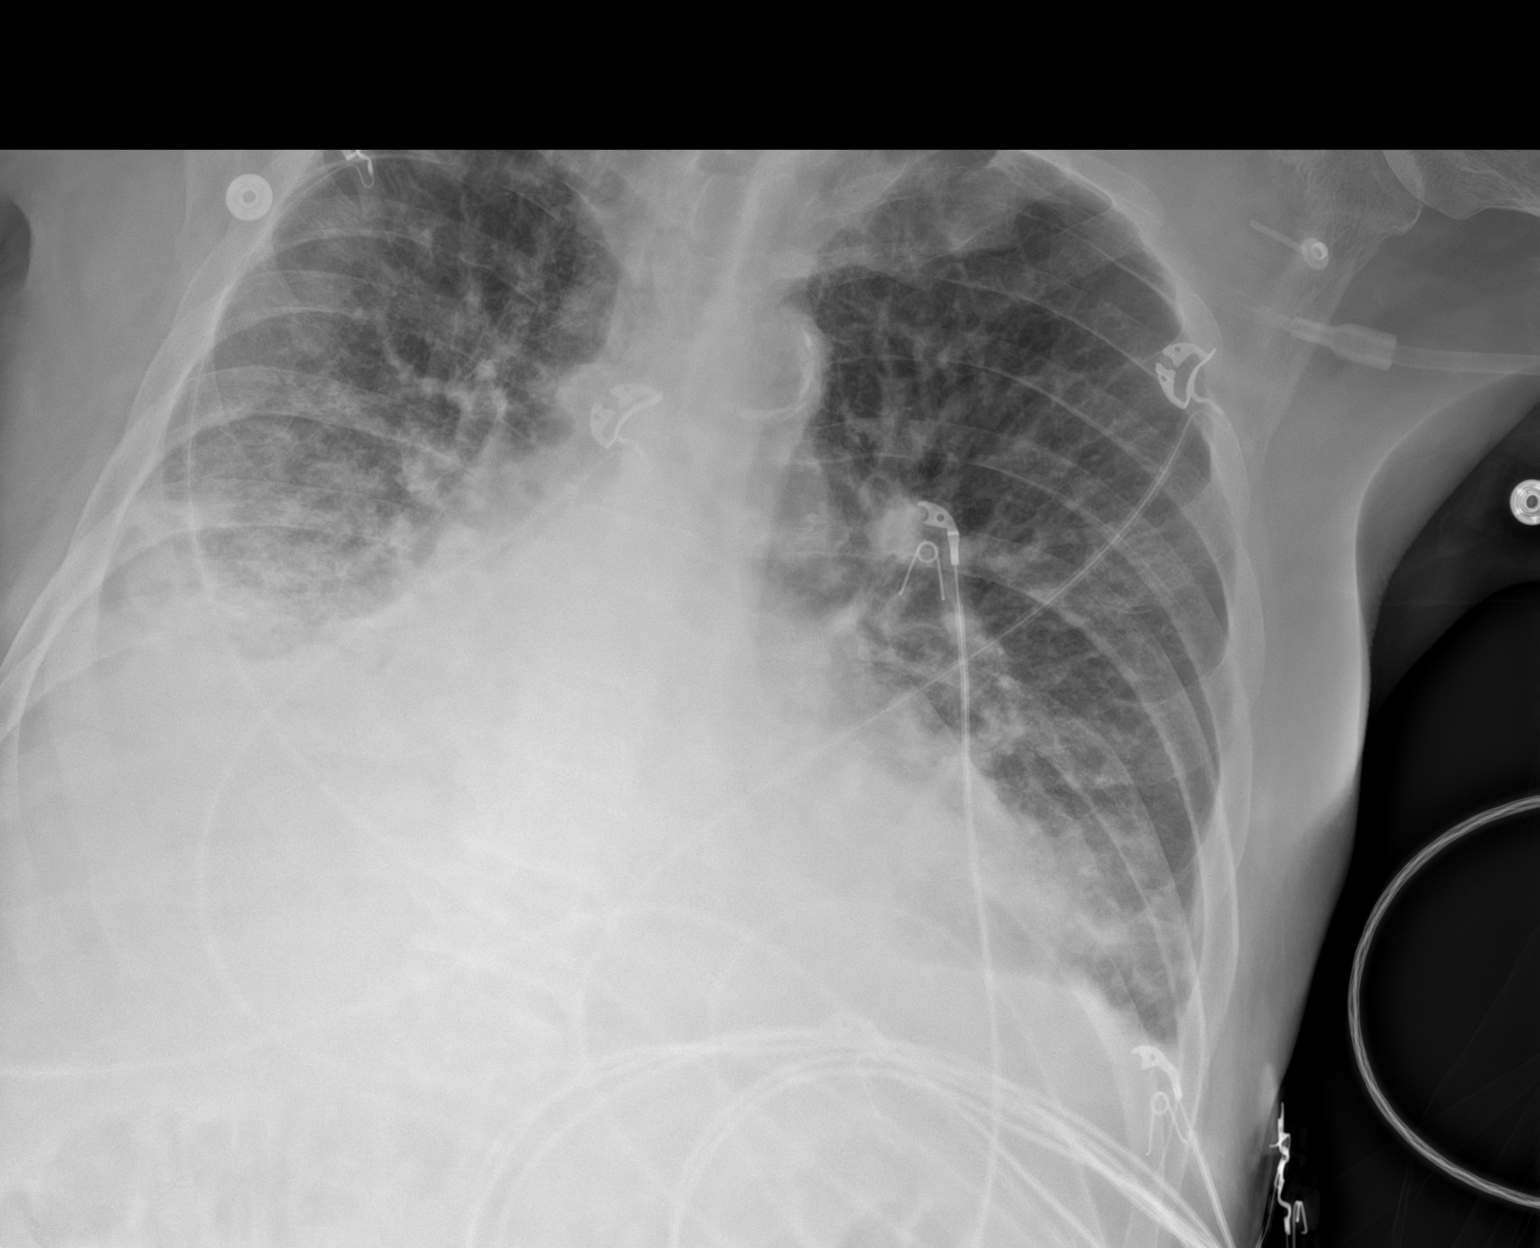

[2 of 2 positions shown; findings below may reference images not displayed]

FINDINGS: Cardiomegaly. Bilateral lower lobe and perihilar airspace disease.
Suspect small right effusion. No pneumothorax or acute bony
abnormality.
IMPRESSION: Cardiomegaly. Bilateral lower lobe airspace opacities could reflect
edema or infection.

Suspect small right effusion.
